# Patient Record
Sex: Female | Born: 1980
Health system: Southern US, Community
[De-identification: ages and names within clinical notes are randomized; demographics above are authoritative.]

## PROBLEM LIST (undated history)

## (undated) DIAGNOSIS — E079 Disorder of thyroid, unspecified: Secondary | ICD-10-CM

## (undated) DIAGNOSIS — F410 Panic disorder [episodic paroxysmal anxiety] without agoraphobia: Secondary | ICD-10-CM

## (undated) DIAGNOSIS — H539 Unspecified visual disturbance: Secondary | ICD-10-CM

## (undated) HISTORY — PX: BACK SURGERY: SHX140

## (undated) HISTORY — PX: CHOLECYSTECTOMY: SHX55

## (undated) HISTORY — DX: Unspecified visual disturbance: H53.9

## (undated) HISTORY — PX: OTHER SURGICAL HISTORY: SHX169

## (undated) HISTORY — PX: CERVICAL SPINE SURGERY: SHX589

---

## 2016-10-07 ENCOUNTER — Emergency Department (HOSPITAL_BASED_OUTPATIENT_CLINIC_OR_DEPARTMENT_OTHER)
Admit: 2016-10-07 | Discharge: 2016-10-07 | Disposition: A | Discharge status: Home / Self Care | Payer: BLUE CROSS/BLUE SHIELD | Attending: Emergency Medicine | Admitting: Emergency Medicine

## 2016-10-07 ENCOUNTER — Encounter (HOSPITAL_COMMUNITY): Payer: Self-pay | Admitting: *Deleted

## 2016-10-07 ENCOUNTER — Emergency Department (HOSPITAL_COMMUNITY)
Admission: EM | Admit: 2016-10-07 | Discharge: 2016-10-07 | Disposition: A | Discharge status: Home / Self Care | Payer: BLUE CROSS/BLUE SHIELD | Attending: Emergency Medicine | Admitting: Emergency Medicine

## 2016-10-07 DIAGNOSIS — S8992XA Unspecified injury of left lower leg, initial encounter: Secondary | ICD-10-CM | POA: Diagnosis present

## 2016-10-07 DIAGNOSIS — Y939 Activity, unspecified: Secondary | ICD-10-CM | POA: Insufficient documentation

## 2016-10-07 DIAGNOSIS — S8412XA Injury of peroneal nerve at lower leg level, left leg, initial encounter: Secondary | ICD-10-CM | POA: Diagnosis not present

## 2016-10-07 DIAGNOSIS — X58XXXA Exposure to other specified factors, initial encounter: Secondary | ICD-10-CM | POA: Diagnosis not present

## 2016-10-07 DIAGNOSIS — Y929 Unspecified place or not applicable: Secondary | ICD-10-CM | POA: Diagnosis not present

## 2016-10-07 DIAGNOSIS — Y999 Unspecified external cause status: Secondary | ICD-10-CM | POA: Insufficient documentation

## 2016-10-07 DIAGNOSIS — S8492XA Injury of unspecified nerve at lower leg level, left leg, initial encounter: Secondary | ICD-10-CM

## 2016-10-07 DIAGNOSIS — M79609 Pain in unspecified limb: Secondary | ICD-10-CM | POA: Diagnosis not present

## 2016-10-07 MED ORDER — METHYLPREDNISOLONE 4 MG PO TBPK: ORAL_TABLET | ORAL | 0 refills | Status: DC

## 2016-10-07 MED ORDER — TRAMADOL HCL 50 MG PO TABS: 50.0000 mg | ORAL_TABLET | Freq: Four times a day (QID) | ORAL | 0 refills | Status: DC | PRN

## 2016-10-07 NOTE — ED Provider Notes (Signed)
[facility]-EMERGENCY DEPT Provider Note   CSN: [id number] Arrival date & time: 10/07/16  1108   By signing my name below, I, [name], attest that this documentation has been prepared under the direction and in the presence of Shaune Pollack, MD. Electronically Signed: [name], ED Scribe. 10/07/16. 2:51 PM.  History   Chief Complaint Chief Complaint  Patient presents with  . Leg Pain    HPI Emily Miles is a 36 y.o. female who presents to the Emergency Department complaining of sharp left groin pain onset yesterday. Pt reports associated left foot numbness. Pt has tried Rx tramadol with no relief of her symptoms. Pt states that she drove 4 hours straight 2 days ago and was sent to the ED by Urgent Care to rule out a DVT. Pt reports that her left groin pain radiates to her left thigh and left calf. Pt left groin pain is worsened with laying flat and alleviated with position change. Pt notes that she has had two similar episodes in the past with no conclusive results. She denies back pain, vision change, numbness in hands, weakness in hands, right leg pain, recent injury, recent trauma, and any other symptoms. Denies being evaluated by a neurologist.    The history is provided by the patient. No language interpreter was used.    History reviewed. No pertinent past medical history.  There are no active problems to display for this patient.   Past Surgical History:  Procedure Laterality Date  . BACK SURGERY    . CHOLECYSTECTOMY    . THYROIDECTOMY      OB History    No data available       Home Medications    Prior to Admission medications   Medication Sig Start Date End Date Taking? Authorizing Provider  methylPREDNISolone (MEDROL DOSEPAK) 4 MG TBPK tablet Take as directed 10/07/16   Shaune Pollack, MD  traMADol (ULTRAM) 50 MG tablet Take 1-2 tablets (50-100 mg total) by mouth every 6 (six) hours as needed for moderate pain or severe pain. 10/07/16   Shaune Pollack, MD     Family History No family history on file.  Social History Social History  Substance Use Topics  . Smoking status: Not on file  . Smokeless tobacco: Not on file  . Alcohol use Not on file     Allergies   Percocet [oxycodone-acetaminophen]   Review of Systems Review of Systems  Musculoskeletal: Positive for myalgias (left thigh and left calf). Negative for arthralgias.  Neurological: Positive for numbness (left foot).  All other systems reviewed and are negative.    Physical Exam Updated Vital Signs BP 123/84 (BP Location: Left Arm)   Pulse 77   Temp 98.5 F (36.9 C) (Oral)   Resp 18   LMP 10/02/2016   SpO2 100%   Physical Exam  Constitutional: She is oriented to person, place, and time. She appears well-developed and well-nourished. No distress.  HENT:  Head: Normocephalic and atraumatic.  Eyes: Conjunctivae are normal.  Neck: Neck supple.  Cardiovascular: Normal rate, regular rhythm and normal heart sounds.  Exam reveals no friction rub.   No murmur heard. Pulmonary/Chest: Effort normal and breath sounds normal. No respiratory distress. She has no wheezes. She has no rales.  Abdominal: She exhibits no distension.  Musculoskeletal: She exhibits no edema.  Neurological: She is alert and oriented to person, place, and time. She exhibits normal muscle tone. Gait normal.  Nl gait. See below.  Skin: Skin is warm. Capillary refill takes  less than 2 seconds.  Psychiatric: She has a normal mood and affect.  Nursing note and vitals reviewed.   Neurological Exam:  Mental Status: Alert and oriented to person, place, and time. Attention and concentration normal. Speech clear. Recent memory is intact. Cranial Nerves: Visual fields grossly intact. EOMI and PERRLA. No nystagmus noted. Facial sensation intact at forehead, maxillary cheek, and chin/mandible bilaterally. No facial asymmetry or weakness. Hearing grossly normal. Uvula is midline, and palate elevates  symmetrically. Normal SCM and trapezius strength. Tongue midline without fasciculations. Motor: Muscle strength 5/5 in proximal and distal UE and LE bilaterally. No pronator drift. Muscle tone normal. Reflexes: 2+ and symmetrical in all four extremities.  Sensation: Intact to light touch in upper and lower extremities distally bilaterally.  Gait: Normal without ataxia. Coordination: Normal FTN bilaterally.   LOWER EXTREMITY EXAM: BILATERAL  INSPECTION & PALPATION: No gross deformity.  No swelling.  No open wounds.  No tenderness to palpation.  SENSORY: sensation is intact to light touch in:  Superficial peroneal nerve distribution (over dorsum of foot) Deep peroneal nerve distribution (over first dorsal web space) Sural nerve distribution (over lateral aspect 5th metatarsal) Saphenous nerve distribution (over medial instep)  MOTOR:  + Motor EHL (great toe dorsiflexion) + FHL (great toe plantar flexion)  + TA (ankle dorsiflexion)  + GSC (ankle plantar flexion)  VASCULAR: 2+ dorsalis pedis and posterior tibialis pulses Capillary refill < 2 sec, toes warm and well-perfused  COMPARTMENTS: Soft, warm, well-perfused No pain with passive extension No parethesias     ED Treatments / Results  DIAGNOSTIC STUDIES: Oxygen Saturation is 100% on RA, nl by my interpretation.    COORDINATION OF CARE: 2:38 PM Discussed treatment plan with pt at bedside which includes LE venous, ultram Rx, medrol dose pack Rx and pt agreed to plan.   Radiology No results found.  Procedures Procedures (including critical care time)  Medications Ordered in ED Medications - No data to display   Initial Impression / Assessment and Plan / ED Course  I have reviewed the triage vital signs and the nursing notes.  Pertinent imaging results that were available during my care of the patient were reviewed by me and considered in my medical decision making (see chart for details).    36 yo F with  PMHx of recurrent transient UE and LE neuropathic pain and weakness, s/p extensive spine w/u, here with mild aching, sharp, throbbing LLE pain that began after travelling to [place]. Sent [facility] from [facility] for eval DVT. Exam as above. Suspect neuropathic pain/neuropraxia, possibly 2/2 meuralgia paresthetica exacerbated by sitting down for prolonged period of time. No midline back pain, exam is not c/w radiculopathy. DVT study is neg and exam shows fully intact distal NV. She o/w has no vision changes, HA, UE symptoms, or sx to suggest MS or systemic neurological d/o, though must consider AIDP, transient polyneuropathy given history. Will give burst dose of steroids, analgesia, and refer for outpt neuro f/u.  Final Clinical Impressions(s) / ED Diagnoses   Final diagnoses:  Neurapraxia of left lower extremity, initial encounter    New Prescriptions New Prescriptions   METHYLPREDNISOLONE (MEDROL DOSEPAK) 4 MG TBPK TABLET    Take as directed   TRAMADOL (ULTRAM) 50 MG TABLET    Take 1-2 tablets (50-100 mg total) by mouth every 6 (six) hours as needed for moderate pain or severe pain.    I personally performed the services described in this documentation, which was scribed in my presence. The recorded  information has been reviewed and is accurate.     Shaune Pollackameron Nero Sawatzky, MD 10/07/16 [phone]

## 2016-10-07 NOTE — ED Triage Notes (Addendum)
Pt reports left groin pain that radiates down her leg. Pt  [facility]. Pt reports 4 hr straight drive on Monday.

## 2016-10-07 NOTE — Progress Notes (Signed)
*  PRELIMINARY RESULTS* Vascular Ultrasound Left lower extremity venous duplex has been completed.  Preliminary findings: No evidence of DVT or baker's cyst. No evidence of superficial thrombosis.    Farrel DemarkJill Eunice, RDMS, RVT  10/07/2016, 2:06 PM

## 2017-03-06 ENCOUNTER — Emergency Department (HOSPITAL_COMMUNITY)
Admission: EM | Admit: 2017-03-06 | Discharge: 2017-03-06 | Disposition: A | Discharge status: Home / Self Care | Payer: BLUE CROSS/BLUE SHIELD | Attending: Emergency Medicine | Admitting: Emergency Medicine

## 2017-03-06 ENCOUNTER — Encounter (HOSPITAL_COMMUNITY): Payer: Self-pay | Admitting: Emergency Medicine

## 2017-03-06 DIAGNOSIS — R0602 Shortness of breath: Secondary | ICD-10-CM | POA: Diagnosis not present

## 2017-03-06 DIAGNOSIS — R112 Nausea with vomiting, unspecified: Secondary | ICD-10-CM | POA: Diagnosis not present

## 2017-03-06 DIAGNOSIS — Z9049 Acquired absence of other specified parts of digestive tract: Secondary | ICD-10-CM | POA: Insufficient documentation

## 2017-03-06 DIAGNOSIS — F410 Panic disorder [episodic paroxysmal anxiety] without agoraphobia: Secondary | ICD-10-CM | POA: Insufficient documentation

## 2017-03-06 DIAGNOSIS — H538 Other visual disturbances: Secondary | ICD-10-CM | POA: Insufficient documentation

## 2017-03-06 DIAGNOSIS — R42 Dizziness and giddiness: Secondary | ICD-10-CM

## 2017-03-06 HISTORY — DX: Panic disorder (episodic paroxysmal anxiety): F41.0

## 2017-03-06 LAB — BASIC METABOLIC PANEL
Anion gap: 7 (ref 5–15)
BUN: 12 mg/dL (ref 6–20)
CO2: 22 mmol/L (ref 22–32)
Calcium: 8.4 mg/dL — ABNORMAL LOW (ref 8.9–10.3)
Chloride: 107 mmol/L (ref 101–111)
Creatinine, Ser: 0.91 mg/dL (ref 0.44–1.00)
GFR calc Af Amer: >60 mL/min (ref >60)
GFR calc non Af Amer: >60 mL/min (ref >60)
Glucose, Bld: 95 mg/dL (ref 65–99)
Potassium: 3.6 mmol/L (ref 3.5–5.1)
Sodium: 136 mmol/L (ref 135–145)

## 2017-03-06 LAB — CBC WITH DIFFERENTIAL/PLATELET
Basophils Absolute: 0 10*3/uL (ref 0.0–0.1)
Basophils Relative: 0 %
Eosinophils Absolute: 0.2 10*3/uL (ref 0.0–0.7)
Eosinophils Relative: 2 %
HCT: 36.8 % (ref 36.0–46.0)
Hemoglobin: 11.9 g/dL — ABNORMAL LOW (ref 12.0–15.0)
Lymphocytes Relative: 36 %
Lymphs Abs: 3.3 10*3/uL (ref 0.7–4.0)
MCH: 28.5 pg (ref 26.0–34.0)
MCHC: 32.3 g/dL (ref 30.0–36.0)
MCV: 88.2 fL (ref 78.0–100.0)
Monocytes Absolute: 0.5 10*3/uL (ref 0.1–1.0)
Monocytes Relative: 6 %
Neutro Abs: 5.2 10*3/uL (ref 1.7–7.7)
Neutrophils Relative %: 56 %
Platelets: 219 10*3/uL (ref 150–400)
RBC: 4.17 MIL/uL (ref 3.87–5.11)
RDW: 13.7 % (ref 11.5–15.5)
WBC: 9.2 10*3/uL (ref 4.0–10.5)

## 2017-03-06 LAB — TSH: TSH: 6.067 u[IU]/mL — ABNORMAL HIGH (ref 0.350–4.500)

## 2017-03-06 LAB — CARBOXYHEMOGLOBIN - COOX: Carboxyhemoglobin: 1.1 % (ref 0.5–1.5)

## 2017-03-06 MED ORDER — SODIUM CHLORIDE 0.9 % IV BOLUS (SEPSIS)
1000.0000 mL | Freq: Once | INTRAVENOUS | Status: AC
  Administered 2017-03-06: 1000 mL via INTRAVENOUS

## 2017-03-06 NOTE — Discharge Instructions (Signed)
Your TSH today was 6.067. Please see your doctor for further evaluation and treatment of this, as well as follow-up of today's visit. Make sure to drink at least 64oz of water daily. Please also see your eye doctor for further evaluation and treatment. Please return to the emergency department immediately if you develop any new or worsening symptoms.
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
## 2017-03-06 NOTE — ED Triage Notes (Signed)
Pt st's after driving 4 hours she went into her house and began shaking, became diaphoretic and vomited x's 1  Pt also st's her vision was blurry at the time.

## 2017-03-08 ENCOUNTER — Encounter (HOSPITAL_COMMUNITY): Payer: Self-pay | Admitting: Emergency Medicine

## 2017-03-08 ENCOUNTER — Emergency Department (HOSPITAL_COMMUNITY)
Admission: EM | Admit: 2017-03-08 | Discharge: 2017-03-09 | Disposition: A | Discharge status: Home / Self Care | Payer: BLUE CROSS/BLUE SHIELD | Attending: Emergency Medicine | Admitting: Emergency Medicine

## 2017-03-08 DIAGNOSIS — R110 Nausea: Secondary | ICD-10-CM | POA: Diagnosis not present

## 2017-03-08 DIAGNOSIS — R531 Weakness: Secondary | ICD-10-CM

## 2017-03-08 DIAGNOSIS — Z79899 Other long term (current) drug therapy: Secondary | ICD-10-CM | POA: Diagnosis not present

## 2017-03-08 DIAGNOSIS — R197 Diarrhea, unspecified: Secondary | ICD-10-CM | POA: Diagnosis not present

## 2017-03-08 DIAGNOSIS — E039 Hypothyroidism, unspecified: Secondary | ICD-10-CM | POA: Insufficient documentation

## 2017-03-08 DIAGNOSIS — R51 Headache: Secondary | ICD-10-CM | POA: Diagnosis not present

## 2017-03-08 DIAGNOSIS — R55 Syncope and collapse: Secondary | ICD-10-CM | POA: Diagnosis present

## 2017-03-08 DIAGNOSIS — M6281 Muscle weakness (generalized): Secondary | ICD-10-CM | POA: Insufficient documentation

## 2017-03-08 HISTORY — DX: Disorder of thyroid, unspecified: E07.9

## 2017-03-08 LAB — BASIC METABOLIC PANEL
ANION GAP: 10 (ref 5–15)
BUN: 12 mg/dL (ref 6–20)
CO2: 22 mmol/L (ref 22–32)
Calcium: 8.6 mg/dL — ABNORMAL LOW (ref 8.9–10.3)
Chloride: 108 mmol/L (ref 101–111)
Creatinine, Ser: 0.89 mg/dL (ref 0.44–1.00)
Glucose, Bld: 97 mg/dL (ref 65–99)
Potassium: 3.9 mmol/L (ref 3.5–5.1)
Sodium: 140 mmol/L (ref 135–145)

## 2017-03-08 LAB — CBC
HCT: 36.4 % (ref 36.0–46.0)
HEMOGLOBIN: 12.1 g/dL (ref 12.0–15.0)
MCH: 28.8 pg (ref 26.0–34.0)
MCHC: 33.2 g/dL (ref 30.0–36.0)
MCV: 86.7 fL (ref 78.0–100.0)
Platelets: 234 10*3/uL (ref 150–400)
RBC: 4.2 MIL/uL (ref 3.87–5.11)
RDW: 13.7 % (ref 11.5–15.5)
WBC: 8.9 10*3/uL (ref 4.0–10.5)

## 2017-03-08 LAB — URINALYSIS, ROUTINE W REFLEX MICROSCOPIC
Bilirubin Urine: NEGATIVE
Glucose, UA: NEGATIVE mg/dL
Hgb urine dipstick: NEGATIVE
Ketones, ur: NEGATIVE mg/dL
LEUKOCYTES UA: NEGATIVE
NITRITE: NEGATIVE
Protein, ur: NEGATIVE mg/dL
SPECIFIC GRAVITY, URINE: 1.012 (ref 1.005–1.030)
pH: 7 (ref 5.0–8.0)

## 2017-03-08 LAB — PREGNANCY, URINE: PREG TEST UR: NEGATIVE

## 2017-03-08 LAB — I-STAT BETA HCG BLOOD, ED (MC, WL, AP ONLY): HCG, QUANTITATIVE: 5.3 m[IU]/mL — AB (ref <5)

## 2017-03-08 MED ORDER — SODIUM CHLORIDE 0.9 % IV BOLUS (SEPSIS)
1000.0000 mL | Freq: Once | INTRAVENOUS | Status: AC
  Administered 2017-03-08: 1000 mL via INTRAVENOUS

## 2017-03-08 NOTE — ED Triage Notes (Signed)
[facility] when she had near syncopal episode. Got really dizzy, weakness in legs, blurred vision, shaking, SOB.  Patient sates that she had same symptoms on Saturday and [facility]. Patient had diarrhea since Saturday.

## 2017-03-09 LAB — T4, FREE: FREE T4: 0.63 ng/dL (ref 0.61–1.12)

## 2017-03-09 LAB — TSH: TSH: 7.89 u[IU]/mL — AB (ref 0.350–4.500)

## 2017-03-09 MED ORDER — LEVOTHYROXINE SODIUM 125 MCG PO TABS
125.0000 ug | ORAL_TABLET | Freq: Every day | ORAL | Status: DC
  Administered 2017-03-09: 125 ug via ORAL
  Filled 2017-03-09: qty 1

## 2017-03-09 MED ORDER — LEVOTHYROXINE SODIUM 125 MCG PO TABS: 125.0000 ug | ORAL_TABLET | Freq: Every day | ORAL | 0 refills | Status: DC

## 2017-03-09 NOTE — ED Notes (Signed)
Tolerating fluids and sandwich well.

## 2017-03-09 NOTE — ED Notes (Signed)
Pt was assisted to restroom with steady.

## 2017-03-09 NOTE — Discharge Instructions (Addendum)
Follow up with endocrinology - 2 names for referral have been provided. Return here with any worsening symptoms or new concerns. Stop taking your Synthroid 112 mcg and take Synthroid 125 mcg daily.

## 2017-03-09 NOTE — ED Notes (Signed)
ED Provider at bedside. 

## 2017-03-09 NOTE — ED Provider Notes (Signed)
[facility]-EMERGENCY DEPT Provider Note   CSN: [id number] Arrival date & time: 03/08/17  1748     History   Chief Complaint Chief Complaint  Patient presents with  . Near Syncope  . Diarrhea    HPI Emily Miles is a 36 y.o. female.  Patient presents with multiple, recurrent episodes profound generalized weakness, "legs unable to hold me up", nausea, headache, diaphoresis. She [facility], felt better and was discharged home. Her second episode was today PTA. She reports a busy morning of errands trying to get ready for an event she is planning for her work and being asymptomatic until the last stop [facility] [employer] when she felt suddenly weak and went to the floor. No syncope or injury. No recent fever or illness.       Past Medical History:  Diagnosis Date  . Panic attacks   . Thyroid disease     There are no active problems to display for this patient.   Past Surgical History:  Procedure Laterality Date  . BACK SURGERY    . CHOLECYSTECTOMY    . THYROIDECTOMY      OB History    No data available       Home Medications    Prior to Admission medications   Medication Sig Start Date End Date Taking? Authorizing Provider  cetirizine (ZYRTEC) 10 MG tablet Take 10 mg by mouth daily.   Yes [provider]  liothyronine (CYTOMEL) 5 MCG tablet Take 5 mcg by mouth 2 (two) times daily.  12/23/16  Yes [provider]  NUVARING 0.12-0.015 MG/24HR vaginal ring Place 1 each vaginally every 28 (twenty-eight) days.  02/03/17  Yes [provider]  sertraline (ZOLOFT) 100 MG tablet Take 100 mg by mouth at bedtime.  02/26/17  Yes [provider]  SYNTHROID 112 MCG tablet Take 112 mcg by mouth daily before breakfast.  02/15/17  Yes [provider]  traZODone (DESYREL) 50 MG tablet Take 50 mg by mouth at bedtime.  01/20/17  Yes [provider]  methylPREDNISolone (MEDROL DOSEPAK) 4 MG TBPK tablet Take as  directed Patient not taking: Reported on 03/08/2017 10/07/16   Shaune PollackIsaacs, Cameron, MD  traMADol (ULTRAM) 50 MG tablet Take 1-2 tablets (50-100 mg total) by mouth every 6 (six) hours as needed for moderate pain or severe pain. Patient not taking: Reported on 03/08/2017 10/07/16   Shaune PollackIsaacs, Cameron, MD    Family History No family history on file.  Social History Social History  Substance Use Topics  . Smoking status: Never Smoker  . Smokeless tobacco: Never Used  . Alcohol use Yes     Allergies   Percocet [oxycodone-acetaminophen]   Review of Systems Review of Systems  Constitutional: Positive for diaphoresis. Negative for chills and fever.  HENT: Negative.   Respiratory: Negative.   Cardiovascular: Negative.   Gastrointestinal: Positive for nausea.  Genitourinary: Negative.   Musculoskeletal: Negative.   Skin: Negative.   Neurological: Positive for dizziness and weakness.     Physical Exam Updated Vital Signs BP 124/73 (BP Location: Right Arm)   Pulse 81   Temp 98.2 F (36.8 C) (Oral)   Resp 13   Ht 5\' 9"  (1.753 m)   Wt 80.3 kg (177 lb)   SpO2 97%   BMI 26.14 kg/m   Physical Exam  Constitutional: She is oriented to person, place, and time. She appears well-developed and well-nourished.  HENT:  Head: Normocephalic.  Neck: Normal range of motion.  Neck supple.  Cardiovascular: Normal rate and regular rhythm.   No murmur heard. Pulmonary/Chest: Effort normal and breath sounds normal. She has no wheezes. She has no rales.  Abdominal: Soft. Bowel sounds are normal. There is no tenderness. There is no rebound and no guarding.  Musculoskeletal: Normal range of motion. She exhibits no edema.  Neurological: She is alert and oriented to person, place, and time. She displays normal reflexes. No sensory deficit. She exhibits normal muscle tone. Coordination normal.  Skin: Skin is warm and dry. No rash noted.  Psychiatric: She has a normal mood and affect.     ED Treatments /  Results  Labs (all labs ordered are listed, but only abnormal results are displayed) Labs Reviewed  BASIC METABOLIC PANEL - Abnormal; Notable for the following:       Result Value   Calcium 8.6 (*)    All other components within normal limits  URINALYSIS, ROUTINE W REFLEX MICROSCOPIC - Abnormal; Notable for the following:    APPearance HAZY (*)    All other components within normal limits  TSH - Abnormal; Notable for the following:    TSH 7.890 (*)    All other components within normal limits  I-STAT BETA HCG BLOOD, ED ([facility], [facility], [facility] ONLY) - Abnormal; Notable for the following:    I-stat hCG, quantitative 5.3 (*)    All other components within normal limits  CBC  PREGNANCY, URINE  T3  T4, FREE  CBG MONITORING, ED    EKG  EKG Interpretation  Date/Time:  Monday March 08 2017 16:07:24 EDT Ventricular Rate:  93 PR Interval:    QRS Duration: 71 QT Interval:  365 QTC Calculation: 454 R Axis:   74 Text Interpretation:  Sinus rhythm normal. no change Confirmed by Arby Barrette [phone]) on 03/08/2017 11:55:38 PM       Radiology No results found.  Procedures Procedures (including critical care time)  Medications Ordered in ED Medications  levothyroxine (SYNTHROID, LEVOTHROID) tablet 125 mcg (not administered)  sodium chloride 0.9 % bolus 1,000 mL (0 mLs Intravenous Stopped 03/09/17 0219)     Initial Impression / Assessment and Plan / ED Course  I have reviewed the triage vital signs and the nursing notes.  Pertinent labs & imaging results that were available during my care of the patient were reviewed by me and considered in my medical decision making (see chart for details).     Patient here for evaluation of weakness as described in HPI. 2nd visit for same.   IVF's given with some improvement. She reports she still have difficulty standing secondary to weakness.   She has a history of thyroidectomy, now on Synthroid. She states that a TSH was done on first evaluation  and found to be elevated. She reports last normal check was in May of this year. Repeat TSH here is elevated to 7.890, more than previous on 03/06/17, found on chart review, of 6.067.   Consider worsening hypothyroid state contributing to symptoms. Will increase Synthroid from 112 mcg to 125 mcg. She will follow up with her doctor for recheck this week. She is still weak but reports she wants to go home and follow up outpatient.   Final Clinical Impressions(s) / ED Diagnoses   Final diagnoses:  Hypothyroidism, unspecified type  Weakness    New Prescriptions New Prescriptions   No medications on file     Danne Harbor 03/15/17 [id number]    Paula Libra, MD 03/18/17 2239

## 2017-03-10 LAB — T3: T3 TOTAL: 90 ng/dL (ref 71–180)

## 2017-03-11 ENCOUNTER — Ambulatory Visit (HOSPITAL_COMMUNITY)
Admission: RE | Admit: 2017-03-11 | Discharge: 2017-03-11 | Disposition: A | Discharge status: Home / Self Care | Payer: BLUE CROSS/BLUE SHIELD | Source: Ambulatory Visit | Attending: Neurology | Admitting: Neurology

## 2017-03-11 ENCOUNTER — Telehealth: Payer: Self-pay | Admitting: Neurology

## 2017-03-11 ENCOUNTER — Encounter: Payer: Self-pay | Admitting: Neurology

## 2017-03-11 ENCOUNTER — Ambulatory Visit (INDEPENDENT_AMBULATORY_CARE_PROVIDER_SITE_OTHER): Payer: BLUE CROSS/BLUE SHIELD | Admitting: Neurology

## 2017-03-11 VITALS — BP 116/72 | HR 84 | Resp 16 | Ht 68.5 in | Wt 173.5 lb

## 2017-03-11 DIAGNOSIS — M4802 Spinal stenosis, cervical region: Secondary | ICD-10-CM | POA: Diagnosis not present

## 2017-03-11 DIAGNOSIS — M5124 Other intervertebral disc displacement, thoracic region: Secondary | ICD-10-CM | POA: Insufficient documentation

## 2017-03-11 DIAGNOSIS — R29898 Other symptoms and signs involving the musculoskeletal system: Secondary | ICD-10-CM | POA: Insufficient documentation

## 2017-03-11 DIAGNOSIS — R200 Anesthesia of skin: Secondary | ICD-10-CM

## 2017-03-11 DIAGNOSIS — Z981 Arthrodesis status: Secondary | ICD-10-CM | POA: Insufficient documentation

## 2017-03-11 DIAGNOSIS — Z9889 Other specified postprocedural states: Secondary | ICD-10-CM | POA: Insufficient documentation

## 2017-03-11 DIAGNOSIS — M50322 Other cervical disc degeneration at C5-C6 level: Secondary | ICD-10-CM | POA: Insufficient documentation

## 2017-03-11 MED ORDER — METHYLPREDNISOLONE 4 MG PO TABS: ORAL_TABLET | ORAL | 0 refills | Status: DC
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
## 2017-03-11 NOTE — Telephone Encounter (Signed)
Patient is scheduled to [facility]. The [employer] is [id number] (exp. 03/11/17 to 04/09/17). Patient is aware of the time and day.

## 2017-03-12 NOTE — Telephone Encounter (Signed)
-----   Message from Asa Lenteichard A Sater, MD sent at 03/11/2017  5:40 PM EDT ----- Please let her know that I looked at the MRI. The spinal cord looks normal. She has her old fusion at C6-C7 and she has some degenerative changes at the level above at C5-C6, but they are not severe enough to lead to weakness. The MRI of the thoracic spine showed a small disc protrusion at T6-T7 but it is not causing any problems.  Since the MRIs did not show a source for her weakness, we need to check a nerve conduction study/EMG some time in the next week. Please check with Beau to see if she could be put on my schedule or anybody else's schedule next week.

## 2017-03-12 NOTE — Telephone Encounter (Signed)
I have spoken with Victorino DikeJennifer this morning and per RAS, reviewed MRI results as below.  Advised EMG/NCV is needed.  She verbalized understanding of same.  Beau is not here today, so I have printed this message and given it to Robin--she will pass it along to Beau on Monday/fim

## 2017-03-15 ENCOUNTER — Telehealth: Payer: Self-pay | Admitting: Neurology

## 2017-03-15 NOTE — Telephone Encounter (Signed)
I have spoken with Victorino DikeJennifer and advised [facility] needs EM[facility]/NCV.  Once ins. approves it, someone will call her to schedule.  She verbalized understanding of same/fim

## 2017-03-15 NOTE — Telephone Encounter (Signed)
Patient called office in reference to believing she is  to be scheduling a ncv/emg.  Please call.

## 2017-03-17 ENCOUNTER — Other Ambulatory Visit: Payer: BLUE CROSS/BLUE SHIELD

## 2017-03-17 ENCOUNTER — Other Ambulatory Visit: Payer: Self-pay | Admitting: Neurology

## 2017-03-17 DIAGNOSIS — R200 Anesthesia of skin: Secondary | ICD-10-CM

## 2017-03-17 DIAGNOSIS — R29898 Other symptoms and signs involving the musculoskeletal system: Secondary | ICD-10-CM

## 2017-03-18 ENCOUNTER — Encounter (INDEPENDENT_AMBULATORY_CARE_PROVIDER_SITE_OTHER): Payer: Self-pay | Admitting: Diagnostic Neuroimaging

## 2017-03-18 ENCOUNTER — Ambulatory Visit (INDEPENDENT_AMBULATORY_CARE_PROVIDER_SITE_OTHER): Payer: BLUE CROSS/BLUE SHIELD | Admitting: Diagnostic Neuroimaging

## 2017-03-18 DIAGNOSIS — R200 Anesthesia of skin: Secondary | ICD-10-CM

## 2017-03-18 DIAGNOSIS — R29898 Other symptoms and signs involving the musculoskeletal system: Secondary | ICD-10-CM

## 2017-03-18 DIAGNOSIS — Z0289 Encounter for other administrative examinations: Secondary | ICD-10-CM

## 2017-03-19 ENCOUNTER — Ambulatory Visit
Admission: RE | Admit: 2017-03-19 | Discharge: 2017-03-19 | Disposition: A | Discharge status: Home / Self Care | Payer: BLUE CROSS/BLUE SHIELD | Source: Ambulatory Visit | Attending: Family Medicine | Admitting: Family Medicine

## 2017-03-19 ENCOUNTER — Inpatient Hospital Stay
Admission: RE | Admit: 2017-03-19 | Discharge: 2017-03-19 | Disposition: A | Discharge status: Home / Self Care | Payer: BLUE CROSS/BLUE SHIELD | Source: Ambulatory Visit | Attending: Family Medicine | Admitting: Family Medicine

## 2017-03-19 ENCOUNTER — Other Ambulatory Visit: Payer: Self-pay | Admitting: Family Medicine

## 2017-03-19 DIAGNOSIS — E038 Other specified hypothyroidism: Secondary | ICD-10-CM

## 2017-03-19 NOTE — Procedures (Signed)
[facility]  NCS (NERVE CONDUCTION STUDY) WITH EMG (ELECTROMYOGRAPHY) REPORT   STUDY DATE: 03/18/17 PATIENT NAME: Emily SchwalbeJennifer Miles DOB: [birth year] MRN: [id number]  ORDERING CLINICIAN: Marvel PlanJindong Xu, MD PhD   TECHNOLOGIST: Despina Ariasichard Sater, MD PhD  ELECTROMYOGRAPHER: Glenford BayleyVikram R. Penumalli, MD  CLINICAL INFORMATION: 36 year old female with lower extremity numbness and weakness.   FINDINGS: NERVE CONDUCTION STUDY: Bilateral peroneal and tibial motor responses and F wave latencies are normal.   Bilateral tibial H reflex responses are normal.   Bilateral sural and superficial peroneal sensory responses are normal.   NEEDLE ELECTROMYOGRAPHY: Needle examination of right vastus medialis, tibials anterior, gastrocnemius and bilateral lower lumbar paraspinal muscles are normal.   IMPRESSION:  This is a normal study. No electrodiagnostic evidence of large fiber neuropathy or lumbar radiculopathy at this time.     INTERPRETING PHYSICIAN:  Suanne MarkerVIKRAM R. PENUMALLI, MD Certified in Neurology, Neurophysiology and Neuroimaging  [facility] [address], [address] [place], [place] [id number] [phone]  [facility]    Nerve / Sites Muscle Latency Ref. Amplitude Ref. Rel Amp Segments Distance Velocity Ref. Area    ms ms mV mV %  cm m/s m/s mVms  L Peroneal - EDB     Ankle EDB 6.0 ?6.5 6.7 ?2.0 100 Ankle - EDB 9   30.5     Fib head EDB 12.8  5.7  84.6 Fib head - Ankle 30 44 ?44 28.0     Pop fossa EDB 15.1  6.7  118 Pop fossa - Fib head 10 45 ?44 35.2         Pop fossa - Ankle      R Peroneal - EDB     Ankle EDB 5.5 ?6.5 5.7 ?2.0 100 Ankle - EDB 9   26.0     Fib head EDB 12.2  4.7  81.8 Fib head - Ankle 30 45 ?44 21.9     Pop fossa EDB 14.9  4.8  102 Pop fossa - Fib head 12 45 ?44 22.6         Pop fossa - Ankle      L Tibial - AH     Ankle AH 5.2 ?5.8 12.1 ?4.0 100 Ankle - AH 9   38.8     Pop fossa AH 14.3  10.8  88.8 Pop fossa - Ankle 37 41 ?41 38.7  R  Tibial - AH     Ankle AH 5.6 ?5.8 16.1 ?4.0 100 Ankle - AH 9   41.7     Pop fossa AH 14.8  9.8  61.3 Pop fossa - Ankle 38 41 ?41 32.2             SNC    Nerve / Sites Rec. Site Peak Lat Ref.  Amp Ref. Segments Distance    ms ms V V  cm  L Sural - Ankle (Calf)     Calf Ankle 4.1 ?4.4 24 ?6 Calf - Ankle 14  R Sural - Ankle (Calf)     Calf Ankle 4.0 ?4.4 24 ?6 Calf - Ankle 14  L Superficial peroneal - Ankle     Lat leg Ankle 4.2 ?4.4 10 ?6 Lat leg - Ankle 14  R Superficial peroneal - Ankle     Lat leg Ankle 4.3 ?4.4 11 ?6 Lat leg - Ankle 14             F  Wave    Nerve F Lat Ref.   ms ms  L Peroneal - EDB 50.9 ?56.0  L Tibial - AH 50.9 ?56.0  R Peroneal - EDB 49.1 ?56.0  R Tibial - AH 50.6 ?56.0             H Reflex    Nerve H Lat Lat Hmax   ms ms   Left Right Ref. Left Right Ref.  Tibial - Soleus 30.4 30.2 ?35.0 30.7 31.3 ?35.0         EMG full       EMG Summary Table    Spontaneous MUAP Recruitment  Muscle IA Fib PSW Fasc Other Amp Dur. Poly Pattern  R. Lumbar paraspinals Normal None None None _______ Normal Normal Normal Normal  R. Vastus medialis Normal None None None _______ Normal Normal Normal Normal  R. Tibialis anterior Normal None None None _______ Normal Normal Normal Normal  R. Gastrocnemius (Medial head) Normal None None None _______ Normal Normal Normal Normal

## 2017-03-22 ENCOUNTER — Telehealth: Payer: Self-pay | Admitting: *Deleted

## 2017-03-22 NOTE — Telephone Encounter (Signed)
-----   Message from Asa Lenteichard A Sater, MD sent at 03/19/2017  1:39 PM EDT ----- Please let her know that the nerve conduction study was normal.

## 2017-03-22 NOTE — Telephone Encounter (Signed)
I have spoken with Emily Miles this afternoon and per RAS, advised that EMG/NCV was normal.  She verbalized understanding of same/fim

## 2017-03-26 ENCOUNTER — Telehealth: Payer: Self-pay | Admitting: Endocrinology

## 2017-03-26 NOTE — Telephone Encounter (Signed)
Patient called in reference to getting sooner appointment due to school starting and having leg weakness.   Please call patient and advise. OK to leave message.

## 2017-03-26 NOTE — Telephone Encounter (Signed)
Please see if patient can come in sooner than current appointment. Please schedule. Thanks!

## 2017-03-27 NOTE — Telephone Encounter (Signed)
She is being followed by neurologist for weakness, her thyroid level is only minimally abnormal and do not think there is a connection, can only see her with a minimum 30 minute scheduled availability

## 2017-04-02 NOTE — Telephone Encounter (Signed)
As of today there is nothing sooner than her current appt.

## 2017-04-05 ENCOUNTER — Telehealth: Payer: Self-pay | Admitting: Endocrinology

## 2017-04-05 NOTE — Telephone Encounter (Signed)
Patient would like to know if she can get an earlier new patient appointment than 08/22.

## 2017-04-21 ENCOUNTER — Encounter: Payer: Self-pay | Admitting: Endocrinology

## 2017-04-21 ENCOUNTER — Ambulatory Visit (INDEPENDENT_AMBULATORY_CARE_PROVIDER_SITE_OTHER): Payer: BLUE CROSS/BLUE SHIELD | Admitting: Endocrinology

## 2017-04-21 VITALS — BP 108/68 | HR 97 | Ht 68.0 in | Wt 178.6 lb

## 2017-04-21 DIAGNOSIS — R635 Abnormal weight gain: Secondary | ICD-10-CM | POA: Diagnosis not present

## 2017-04-21 DIAGNOSIS — E063 Autoimmune thyroiditis: Secondary | ICD-10-CM

## 2017-04-21 DIAGNOSIS — M6281 Muscle weakness (generalized): Secondary | ICD-10-CM

## 2017-04-21 DIAGNOSIS — R531 Weakness: Secondary | ICD-10-CM

## 2017-04-21 NOTE — Progress Notes (Signed)
Patient ID: Emily Miles, female   DOB: 03/04/1981, 36 y.o.   MRN: [id number]           Referring Physician: Reola Calkins  Reason for Appointment:  Hypothyroidism and weakness, new visit    History of Present Illness:   The patient has been on thyroid supplements since 2001 Initially apparently she was told to take thyroid supplement because of having a thyroid nodule or cyst She had hemithyroidectomy in 2002 for a thyroid nodule that was benign pathology  She had been continued on thyroid supplement subsequently and apparently was needing progressively higher doses of levothyroxine About 6 years ago patient was switched by an endocrinologist to Cytomel and levothyroxine When she was taking 10 g of Cytomel twice a day she was having palpitations and feeling jittery and the dose was reduced  At times she has had an increase of her thyroid medication made an when her thyroid levels are low she feels exhausted and has some weight gain but no other typical symptoms She had been feeling fairly well this year until an episode of syncope in July. In April 2018 her TSH done by her PCP was 0.7  She has been taking 125 g of levothyroxine since 7/18 when her TSH was high  However the patient continues to feel weak and tired and also is gaining weight She has not been able to be active or do much walking because of leg weakness         Patient's weight history is as follows:  Wt Readings from Last 3 Encounters:  04/21/17 178 lb 9.6 oz (81 kg)  03/11/17 173 lb 8 oz (78.7 kg)  03/08/17 177 lb (80.3 kg)    Thyroid function results have been as follows:  Lab Results  Component Value Date   TSH 7.890 (H) 03/09/2017   TSH 6.067 (H) 03/06/2017   FREET4 0.63 03/09/2017    Lab Results  Component Value Date   TSH 7.890 (H) 03/09/2017   TSH 6.067 (H) 03/06/2017      Past Medical History:  Diagnosis Date  . Panic attacks   . Thyroid disease   . Vision abnormalities     Past Surgical  History:  Procedure Laterality Date  . BACK SURGERY    . CHOLECYSTECTOMY    . Hemithyroidectomy      Family History  Problem Relation Age of Onset  . Diabetes Mother   . Hypothyroidism Mother   . Diverticulitis Father     Social History:  reports that she has never smoked. She has never used smokeless tobacco. She reports that she drinks alcohol. She reports that she does not use drugs.  Allergies:  Allergies  Allergen Reactions  . Percocet [Oxycodone-Acetaminophen]     Allergies as of 04/21/2017      Reactions   Percocet [oxycodone-acetaminophen]       Medication List       Accurate as of 04/21/17  3:10 PM. Always use your most recent med list.          buPROPion 150 MG 24 hr tablet Commonly known as:  WELLBUTRIN XL   cetirizine 10 MG tablet Commonly known as:  ZYRTEC Take 10 mg by mouth daily.   levothyroxine 125 MCG tablet Commonly known as:  SYNTHROID Take 1 tablet (125 mcg total) by mouth daily before breakfast.   liothyronine 5 MCG tablet Commonly known as:  CYTOMEL Take 5 mcg by mouth 2 (two) times daily.   Melatonin 5 MG Tabs Take  1 tablet by mouth daily. Takes at night   methylPREDNISolone 4 MG Tbpk tablet Commonly known as:  MEDROL DOSEPAK See admin instructions.   NUVARING 0.12-0.015 MG/24HR vaginal ring Generic drug:  etonogestrel-ethinyl estradiol Place 1 each vaginally every 28 (twenty-eight) days.   PROBIOTIC ACIDOPHILUS PO Take 1 capsule by mouth daily. Takes in the morning   sertraline 100 MG tablet Commonly known as:  ZOLOFT Take 50 mg by mouth at bedtime.   sertraline 25 MG tablet Commonly known as:  ZOLOFT   traZODone 50 MG tablet Commonly known as:  DESYREL Take 50 mg by mouth at bedtime.            Discharge Care Instructions        Start     Ordered   04/21/17 0000  Cortisol     04/21/17 1505   04/21/17 0000  TSH     04/21/17 1505   04/21/17 0000  T4, free     04/21/17 1505   04/21/17 0000  T3, free      04/21/17 1505   04/21/17 0000  Estradiol     04/21/17 1505   04/21/17 0000  Comprehensive metabolic panel     04/21/17 1505   04/21/17 0000  CK (Creatine Kinase)     04/21/17 1505       Review of Systems  Constitutional: Positive for weight gain.  HENT: Positive for headaches.        Somewhat more recently, has pain in the back of her neck and sides of her head  Cardiovascular: Positive for palpitations.  Gastrointestinal: Positive for diarrhea.  Endocrine: Positive for fatigue and heat intolerance.  Skin: Positive for rash.  Neurological: Positive for weakness. Negative for numbness.  Psychiatric/Behavioral:       Is on treatment for depression   B12 Done previously was 278             Examination:    BP 108/68   Pulse 97   Ht 5\' 8"  (1.727 m)   Wt 178 lb 9.6 oz (81 kg)   SpO2 97%   BMI 27.16 kg/m   GENERAL:  Average build. No features of central obesity with supraclavicular fat pads or buffalo hump  No pallor, clubbing, lymphadenopathy or edema.    Skin:  no pigmentation.  Has some areas of cystic acne on face No hirsutism or excessive body here No alopecia No ecchymoses seen, no thinning of the skin present  EYES:  No prominence of the eyes or swelling of the eyelids.  Questionable prominence of the left optic disk seen, no papilledema.  Blood vessels normal   ENT: Oral mucosa and tongue normal.  THYROID:  Not palpable. No carotid bruit heard  HEART:  Normal  S1 and S2; no murmur or click.  CHEST:    Lungs: Vescicular breath sounds heard equally.  No crepitations/ wheeze.  ABDOMEN:  No distention.  Liver and spleen not palpable.  No other mass or tenderness.  NEUROLOGICAL: The power in the proximal leg muscles is 3+/5 Reflexes are bilaterally normal to brisk at biceps, normal at ankles.  JOINTS:  Normal.   Assessment:  HYPOTHYROIDISM, likely autoimmune with long history of thyroid supplementation Her TSH was relatively higher in July along with  low normal free T4 and T3 levels, however labs were done when she was acutely ill Her symptoms of fatigue and weakness are out of proportion to her TSH level She is taking a relatively higher dose of Synthroid  since her labs were done Although she is taking Cytomel twice a day not clear if she had benefited from using this in combination with levothyroxine and may need to consider stopping it because of recent issues with relatively fast heart rate  Weight gain of unclear etiology, does not appear to have any cushingoid features, may be related to her being less active lately  WEAKNESS especially leg muscles of unclear etiology.  No neurological abnormality found on evaluation  Episodes of syncope/near syncope: Etiology unclear but appear to be cardiovascular related   PLAN:   Check a.m. cortisol, repeat thyroid functions, CPK, chemistry panel and estradiol level  Follow-up to be determined   [facility] 04/21/2017, 3:10 PM   Consultation note copy sent to the PCP  Note: This office note was prepared with Dragon voice recognition system technology. Any transcriptional errors that result from this process are unintentional.

## 2017-04-22 ENCOUNTER — Other Ambulatory Visit (INDEPENDENT_AMBULATORY_CARE_PROVIDER_SITE_OTHER): Payer: BLUE CROSS/BLUE SHIELD

## 2017-04-22 DIAGNOSIS — R531 Weakness: Secondary | ICD-10-CM

## 2017-04-22 DIAGNOSIS — M6281 Muscle weakness (generalized): Secondary | ICD-10-CM | POA: Diagnosis not present

## 2017-04-22 DIAGNOSIS — R635 Abnormal weight gain: Secondary | ICD-10-CM | POA: Diagnosis not present

## 2017-04-22 DIAGNOSIS — E063 Autoimmune thyroiditis: Secondary | ICD-10-CM

## 2017-04-22 LAB — COMPREHENSIVE METABOLIC PANEL
ALBUMIN: 4.2 g/dL (ref 3.5–5.2)
ALT: 14 U/L (ref 0–35)
AST: 15 U/L (ref 0–37)
Alkaline Phosphatase: 45 U/L (ref 39–117)
BUN: 15 mg/dL (ref 6–23)
CALCIUM: 9 mg/dL (ref 8.4–10.5)
CHLORIDE: 107 meq/L (ref 96–112)
CO2: 24 mEq/L (ref 19–32)
Creatinine, Ser: 0.84 mg/dL (ref 0.40–1.20)
GFR: 80.69 mL/min (ref >60.00)
Glucose, Bld: 106 mg/dL — ABNORMAL HIGH (ref 70–99)
POTASSIUM: 4.3 meq/L (ref 3.5–5.1)
SODIUM: 138 meq/L (ref 135–145)
Total Bilirubin: 0.2 mg/dL (ref 0.2–1.2)
Total Protein: 7 g/dL (ref 6.0–8.3)

## 2017-04-22 LAB — CORTISOL: CORTISOL PLASMA: 12.5 ug/dL

## 2017-04-22 LAB — T4, FREE: Free T4: 0.99 ng/dL (ref 0.60–1.60)

## 2017-04-22 LAB — CK: CK TOTAL: 88 U/L (ref 7–177)

## 2017-04-22 LAB — T3, FREE: T3, Free: 3.4 pg/mL (ref 2.3–4.2)

## 2017-04-22 LAB — TSH: TSH: 2.89 u[IU]/mL (ref 0.35–4.50)

## 2017-04-23 ENCOUNTER — Encounter: Payer: Self-pay | Admitting: Endocrinology

## 2017-04-24 ENCOUNTER — Other Ambulatory Visit: Payer: Self-pay | Admitting: Endocrinology

## 2017-04-24 DIAGNOSIS — R635 Abnormal weight gain: Secondary | ICD-10-CM

## 2017-04-24 DIAGNOSIS — M6281 Muscle weakness (generalized): Secondary | ICD-10-CM

## 2017-04-24 MED ORDER — DEXAMETHASONE 1 MG PO TABS: ORAL_TABLET | ORAL | 0 refills | Status: DC

## 2017-04-26 ENCOUNTER — Telehealth: Payer: Self-pay | Admitting: Neurology

## 2017-04-26 NOTE — Telephone Encounter (Signed)
I spoke to Dr. Reola Calkins at [facility] about [place]. She is still having difficulty with weakness.   Spinal cord MRI and EMG were noncontributory.   Please see if we can work her in some time this week.

## 2017-04-26 NOTE — Telephone Encounter (Signed)
I have spoken with Emily Miles this am and given appt. for 04/29/17, arrival time 1300 for a 1330 appt/fim

## 2017-04-26 NOTE — Telephone Encounter (Signed)
Dr. Reola Calkins with [facility] is calling to discuss the patient's health. It is not an emergency. He can be reached at [phone].

## 2017-04-27 ENCOUNTER — Other Ambulatory Visit: Payer: Self-pay | Admitting: Endocrinology

## 2017-04-27 ENCOUNTER — Other Ambulatory Visit (INDEPENDENT_AMBULATORY_CARE_PROVIDER_SITE_OTHER): Payer: BLUE CROSS/BLUE SHIELD

## 2017-04-27 DIAGNOSIS — M6281 Muscle weakness (generalized): Secondary | ICD-10-CM

## 2017-04-27 DIAGNOSIS — E063 Autoimmune thyroiditis: Secondary | ICD-10-CM

## 2017-04-27 DIAGNOSIS — R635 Abnormal weight gain: Secondary | ICD-10-CM | POA: Diagnosis not present

## 2017-04-27 LAB — CORTISOL: Cortisol, Plasma: 0.6 ug/dL

## 2017-04-27 LAB — SEDIMENTATION RATE: Sed Rate: 8 mm/hr (ref 0–20)

## 2017-04-28 LAB — ESTRADIOL: ESTRADIOL: 5.7 pg/mL

## 2017-04-29 ENCOUNTER — Ambulatory Visit
Admission: RE | Admit: 2017-04-29 | Discharge: 2017-04-29 | Disposition: A | Discharge status: Home / Self Care | Payer: BLUE CROSS/BLUE SHIELD | Source: Ambulatory Visit | Attending: Neurology | Admitting: Neurology

## 2017-04-29 ENCOUNTER — Ambulatory Visit (INDEPENDENT_AMBULATORY_CARE_PROVIDER_SITE_OTHER): Payer: BLUE CROSS/BLUE SHIELD | Admitting: Neurology

## 2017-04-29 ENCOUNTER — Encounter: Payer: Self-pay | Admitting: Neurology

## 2017-04-29 VITALS — BP 125/81 | HR 85 | Resp 16 | Ht 68.0 in | Wt 176.5 lb

## 2017-04-29 DIAGNOSIS — R29898 Other symptoms and signs involving the musculoskeletal system: Secondary | ICD-10-CM

## 2017-04-29 DIAGNOSIS — Z9889 Other specified postprocedural states: Secondary | ICD-10-CM

## 2017-04-29 DIAGNOSIS — M545 Low back pain, unspecified: Secondary | ICD-10-CM

## 2017-04-29 MED ORDER — ETODOLAC 400 MG PO TABS: 400.0000 mg | ORAL_TABLET | Freq: Two times a day (BID) | ORAL | 5 refills | Status: DC
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
## 2017-04-30 ENCOUNTER — Telehealth: Payer: Self-pay | Admitting: *Deleted

## 2017-04-30 NOTE — Telephone Encounter (Signed)
-----   Message from Asa Lenteichard A Sater, MD sent at 04/29/2017  5:20 PM EDT ----- Please note that the x-ray of the coccyx and sacrum was normal.

## 2017-04-30 NOTE — Telephone Encounter (Signed)
I have spoken with Emily DikeJennifer this morning and per RAS, advised x-rays of coccyx and sacrum are normal.  She verbalized understanding of same/fim

## 2017-05-05 ENCOUNTER — Ambulatory Visit: Payer: BLUE CROSS/BLUE SHIELD | Admitting: Endocrinology

## 2017-05-05 LAB — STRIATED MUSCLE ANTIBODY: Anti-striation Abs: NEGATIVE

## 2017-05-05 LAB — ACETYLCHOLINE RECEPTOR, MODULATING: Acetylcholine Modulat Ab: <12 % (ref 0–20)

## 2017-05-05 LAB — ACETYLCHOLINE RECEPTOR, BINDING: AChR Binding Ab, Serum: <0.03 nmol/L (ref 0.00–0.24)

## 2017-05-05 LAB — VITAMIN B12: VITAMIN B 12: 343 pg/mL (ref 232–1245)

## 2017-05-05 LAB — ACETYLCHOLINE RECEPTOR, BLOCKING: Acetylchol Block Ab: 12 % (ref 0–25)

## 2017-05-06 ENCOUNTER — Telehealth: Payer: Self-pay | Admitting: *Deleted

## 2017-05-06 NOTE — Telephone Encounter (Signed)
LMOM (identified vm) that per RAS, lab work done in our office is ok.  She does not need to return this call unless she has questions/fim

## 2017-05-06 NOTE — Telephone Encounter (Signed)
-----   Message from Asa Lenteichard A Sater, MD sent at 05/06/2017  8:37 AM EDT ----- Please let the patient know that the lab work is fine.

## 2017-05-07 ENCOUNTER — Other Ambulatory Visit: Payer: Self-pay

## 2017-05-07 MED ORDER — LEVOTHYROXINE SODIUM 125 MCG PO TABS: 125.0000 ug | ORAL_TABLET | Freq: Every day | ORAL | 0 refills | Status: DC

## 2017-05-19 ENCOUNTER — Encounter: Payer: Self-pay | Admitting: Physical Therapy

## 2017-05-19 ENCOUNTER — Ambulatory Visit: Payer: BLUE CROSS/BLUE SHIELD | Attending: Neurology | Admitting: Physical Therapy

## 2017-05-19 DIAGNOSIS — M79605 Pain in left leg: Secondary | ICD-10-CM | POA: Diagnosis present

## 2017-05-19 DIAGNOSIS — R262 Difficulty in walking, not elsewhere classified: Secondary | ICD-10-CM | POA: Insufficient documentation

## 2017-05-19 DIAGNOSIS — R208 Other disturbances of skin sensation: Secondary | ICD-10-CM | POA: Diagnosis present

## 2017-05-19 DIAGNOSIS — M6281 Muscle weakness (generalized): Secondary | ICD-10-CM | POA: Diagnosis present
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
## 2017-05-20 ENCOUNTER — Other Ambulatory Visit: Payer: Self-pay | Admitting: Family Medicine

## 2017-05-20 DIAGNOSIS — R102 Pelvic and perineal pain: Secondary | ICD-10-CM

## 2017-05-20 DIAGNOSIS — R29898 Other symptoms and signs involving the musculoskeletal system: Secondary | ICD-10-CM

## 2017-05-20 DIAGNOSIS — R140 Abdominal distension (gaseous): Secondary | ICD-10-CM

## 2017-05-26 ENCOUNTER — Encounter: Payer: Self-pay | Admitting: Physical Therapy

## 2017-05-26 ENCOUNTER — Ambulatory Visit: Payer: BLUE CROSS/BLUE SHIELD | Admitting: Physical Therapy

## 2017-05-26 DIAGNOSIS — M79605 Pain in left leg: Secondary | ICD-10-CM

## 2017-05-26 DIAGNOSIS — R262 Difficulty in walking, not elsewhere classified: Secondary | ICD-10-CM

## 2017-05-26 DIAGNOSIS — M6281 Muscle weakness (generalized): Secondary | ICD-10-CM

## 2017-05-26 DIAGNOSIS — R208 Other disturbances of skin sensation: Secondary | ICD-10-CM
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
## 2017-05-26 NOTE — Patient Instructions (Addendum)
Gastroc / Heel Cord Stretch - On Step    Stand with heels over edge of stair. Holding rail, lower heels until stretch is felt in calf of legs, hold 30 seconds. Repeat _2__ times. Do _2__ times per day.  Hamstring Stretch    Place sound foot on the floor, keep back and residual limb knee straight. Lean forward until a stretch is felt in back of residual limb thigh. Hold __30__ seconds. Repeat __2__ times. Do __2__ sessions per day.  Piriformis Stretch, Sitting    Sit, one ankle on opposite knee, same-side hand on crossed knee. Push down on knee, keeping spine straight. Lean torso forward, with flat back, until tension is felt in hamstrings and gluteals of crossed-leg side. Hold _30__ seconds.  Repeat __2_ times per session. Do _2__ sessions per day.  BROOM STICK EXERCISE: PLACE BROOM STICK UNDER LEFT LEG, OVER RIGHT LEG.  PRESS DOWN WITH LEFT LEG AND UP WITH RIGHT LEG AT SAME TIME (DON'T HOLD YOUR BREATH) FOR 6 SECONDS.  REPEAT 8-10 TIMES.  Heel Squeeze (Prone)    Abdomen supported, knees apart: bend both knees and press heels together gently for 6 seconds Repeat __10__ times per set. Do __2__ sets day   AFTER HEEL PRESSES ALLOW HEELS TO FALL OUT TO SIDE AND HOLD 3-4 SECONDS, 5-6 TIMES  Hip Internal Rotation (Eccentric) - Prone, Knees Flexed to 90 Degrees    Lie on stomach with knees flexed to 90. Slowly lower legs outward for 3-5 seconds. Do not move knees  Return to start. _10__ reps per set, _2__ sets per day

## 2017-05-27 ENCOUNTER — Ambulatory Visit
Admission: RE | Admit: 2017-05-27 | Discharge: 2017-05-27 | Disposition: A | Discharge status: Home / Self Care | Payer: BLUE CROSS/BLUE SHIELD | Source: Ambulatory Visit | Attending: Family Medicine | Admitting: Family Medicine

## 2017-05-27 DIAGNOSIS — R140 Abdominal distension (gaseous): Secondary | ICD-10-CM

## 2017-05-27 DIAGNOSIS — R29898 Other symptoms and signs involving the musculoskeletal system: Secondary | ICD-10-CM

## 2017-05-27 DIAGNOSIS — R102 Pelvic and perineal pain: Secondary | ICD-10-CM

## 2017-05-31 ENCOUNTER — Ambulatory Visit: Payer: BLUE CROSS/BLUE SHIELD | Admitting: Physical Therapy

## 2017-06-02 ENCOUNTER — Ambulatory Visit: Payer: BLUE CROSS/BLUE SHIELD | Admitting: Physical Therapy

## 2017-06-02 ENCOUNTER — Encounter: Payer: BLUE CROSS/BLUE SHIELD | Admitting: Endocrinology

## 2017-06-02 ENCOUNTER — Encounter: Payer: Self-pay | Admitting: Endocrinology

## 2017-06-02 ENCOUNTER — Other Ambulatory Visit (INDEPENDENT_AMBULATORY_CARE_PROVIDER_SITE_OTHER): Payer: BLUE CROSS/BLUE SHIELD

## 2017-06-02 VITALS — BP 108/76 | HR 86 | Ht 68.0 in | Wt 179.2 lb

## 2017-06-02 DIAGNOSIS — E063 Autoimmune thyroiditis: Secondary | ICD-10-CM | POA: Diagnosis not present

## 2017-06-02 LAB — TSH: TSH: 2.5 u[IU]/mL (ref 0.35–4.50)

## 2017-06-02 LAB — T3, FREE: T3, Free: 2.8 pg/mL (ref 2.3–4.2)

## 2017-06-02 LAB — T4, FREE: Free T4: 0.97 ng/dL (ref 0.60–1.60)

## 2017-06-02 NOTE — Progress Notes (Signed)
This encounter was created in error - please disregard.

## 2017-06-04 ENCOUNTER — Encounter: Payer: Self-pay | Admitting: Physical Therapy

## 2017-06-04 ENCOUNTER — Ambulatory Visit: Payer: BLUE CROSS/BLUE SHIELD | Attending: Neurology | Admitting: Physical Therapy

## 2017-06-04 DIAGNOSIS — M6281 Muscle weakness (generalized): Secondary | ICD-10-CM | POA: Diagnosis not present

## 2017-06-04 DIAGNOSIS — R208 Other disturbances of skin sensation: Secondary | ICD-10-CM | POA: Diagnosis present

## 2017-06-04 DIAGNOSIS — R262 Difficulty in walking, not elsewhere classified: Secondary | ICD-10-CM | POA: Diagnosis present

## 2017-06-04 DIAGNOSIS — M79605 Pain in left leg: Secondary | ICD-10-CM

## 2017-06-04 NOTE — Patient Instructions (Addendum)
Abductor Strength: Bridge Pose (Strap)    Make strap wide enough to brace knees at hip width. Press into strap with knees. Hold for __1__ breaths. Repeat __10__ times.  Copyright  VHI. All rights reserved.   Bridge    Lying on back, legs bent 90, feet flat on floor. Press up hips and torso, reaching hands to feet. Hold for __1__ breaths.  Repeat 10 times ADVANCED: Clasp hands underneath back and squeeze shoulder blades together, lifting upper body onto outside of shoulders. .   Also provided pt with handout of TA bracing exercises  Abdominal Breathing Lying Down    Place one hand on stomach. Breathe in slowly through nose, letting stomach rise. Breathe out gently through pursed lips, letting stomach fall. Breathe out for at least twice as long as you breathe in. Repeat _10___ times, __2__ times daily.  Bracing With Knee Fallout (Hook-Lying)    Breathe IN.  While breathing OUT- tighten pelvic floor and abdominals drop one knee out to side (20-30 deg). Keep opposite hip still.  Return to upright and repeat with other leg.   Repeat _10__ times each leg. Do _2__ times a day.  Bracing With Leg March (Hook-Lying)    Breathe IN.  While breathing OUT- tighten pelvic floor and abdominals and hold. Lift one foot _6-10__ inches and return to floor.  Repeat sequence with other leg lifting. Repeat _10__ times each leg. Do _2__ times a day.   Bracing With Heel Slides (Supine)    Breathe IN.  While breathing OUT- tighten pelvic floor and abdominals and hold. Slide one heel to bottom and back up.  Repeat sequence with other leg. Repeat _10__ times each leg. Do _2__ times a day.
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
## 2017-06-08 ENCOUNTER — Encounter: Payer: Self-pay | Admitting: Endocrinology

## 2017-06-08 ENCOUNTER — Ambulatory Visit (INDEPENDENT_AMBULATORY_CARE_PROVIDER_SITE_OTHER): Payer: BLUE CROSS/BLUE SHIELD | Admitting: Endocrinology

## 2017-06-08 VITALS — BP 104/70 | HR 86 | Ht 68.0 in | Wt 179.4 lb

## 2017-06-08 DIAGNOSIS — E063 Autoimmune thyroiditis: Secondary | ICD-10-CM

## 2017-06-08 MED ORDER — LIOTHYRONINE SODIUM 5 MCG PO TABS
ORAL_TABLET | ORAL | 3 refills | Status: DC
Start: 2017-06-08 — End: 2017-11-29

## 2017-06-08 MED ORDER — LEVOTHYROXINE SODIUM 125 MCG PO TABS: 125.0000 ug | ORAL_TABLET | Freq: Every day | ORAL | 1 refills | Status: DC

## 2017-06-08 NOTE — Patient Instructions (Signed)
Add extra 1/2 Cytomel in am

## 2017-06-08 NOTE — Progress Notes (Signed)
Patient ID: Emily Miles, female   DOB: 1981/05/09, 35 y.o.   MRN: [id number]           Referring Physician: Reola Miles  Reason for Appointment:  Hypothyroidism and weakness, new visit    History of Present Illness:   The patient has been on thyroid supplements since 2001 Initially apparently she was told to take thyroid supplement because of having a thyroid nodule or cyst She had hemithyroidectomy in 2002 for a thyroid nodule that was benign pathology  She had been continued on thyroid supplement subsequently and apparently was needing progressively higher doses of levothyroxine About 6 years ago patient was switched by an endocrinologist to Cytomel and levothyroxine At times she has had an increase of her thyroid medication made an when her thyroid levels are low she feels exhausted and has some weight gain but no other typical symptoms  She had been feeling fairly well this year until an episode of syncope in July. In April 2018 her TSH done by her PCP was 0.7  She has been taking 125 g of levothyroxine since 7/18 when her TSH was high  RECENT HISTORY:  On her initial consultation in 8/18 although she was feeling weak, tired and difficulty with staying up as well as weight gain her thyroid levels were normal and her doses were continued unchanged She apparently has some leg muscle weakness and is improving with some physical therapy  She does still feel tired in the afternoon or evenings but overall feels a little better         Patient's weight history is as follows:  Wt Readings from Last 3 Encounters:  06/08/17 179 lb 6.4 oz (81.4 kg)  06/02/17 179 lb 3.2 oz (81.3 kg)  04/29/17 176 lb 8 oz (80.1 kg)    Thyroid function results have been as follows:  Lab Results  Component Value Date   TSH 2.50 06/02/2017   TSH 2.89 04/22/2017   TSH 7.890 (H) 03/09/2017   TSH 6.067 (H) 03/06/2017   FREET4 0.97 06/02/2017   FREET4 0.99 04/22/2017   FREET4 0.63 03/09/2017   T3FREE  2.8 06/02/2017   T3FREE 3.4 04/22/2017    Lab Results  Component Value Date   TSH 2.50 06/02/2017   TSH 2.89 04/22/2017   TSH 7.890 (H) 03/09/2017   TSH 6.067 (H) 03/06/2017      Past Medical History:  Diagnosis Date  . Panic attacks   . Thyroid disease   . Vision abnormalities     Past Surgical History:  Procedure Laterality Date  . BACK SURGERY    . CHOLECYSTECTOMY    . Hemithyroidectomy      Family History  Problem Relation Age of Onset  . Diabetes Mother   . Hypothyroidism Mother   . Diverticulitis Father     Social History:  reports that she has never smoked. She has never used smokeless tobacco. She reports that she drinks alcohol. She reports that she does not use drugs.  Allergies:  Allergies  Allergen Reactions  . Percocet [Oxycodone-Acetaminophen]     Allergies as of 06/08/2017      Reactions   Percocet [oxycodone-acetaminophen]       Medication List       Accurate as of 06/08/17  9:20 AM. Always use your most recent med list.          buPROPion 300 MG 24 hr tablet Commonly known as:  WELLBUTRIN XL Take 300 mg by mouth daily.   cetirizine 10  MG tablet Commonly known as:  ZYRTEC Take 10 mg by mouth daily.   etodolac 400 MG tablet Commonly known as:  LODINE Take 1 tablet (400 mg total) by mouth 2 (two) times daily.   levothyroxine 125 MCG tablet Commonly known as:  SYNTHROID Take 1 tablet (125 mcg total) by mouth daily before breakfast.   liothyronine 5 MCG tablet Commonly known as:  CYTOMEL Take 5 mcg by mouth 2 (two) times daily.   Melatonin 5 MG Tabs Take 1 tablet by mouth daily. Takes at night   NUVARING 0.12-0.015 MG/24HR vaginal ring Generic drug:  etonogestrel-ethinyl estradiol Place 1 each vaginally every 28 (twenty-eight) days.   PROBIOTIC ACIDOPHILUS PO Take 1 capsule by mouth daily. Takes in the morning   traZODone 50 MG tablet Commonly known as:  DESYREL Take 50 mg by mouth at bedtime.        Review of  Systems   B12 repeated recently normal   Now taking Wellbutrin for depression              Examination:    BP 104/70   Pulse 86   Ht  (1.727 m)   Wt 179 lb 6.4 oz (81.4 kg)   SpO2 97%   BMI 27.28 kg/m      Assessment:  HYPOTHYROIDISM, likely autoimmune with long history of thyroid supplementation No history of goiter on exam  She is on a consistent dose of Synthroid 125 g and liothyronine 5 g twice a day Her treatment was continued unchanged on her initial consultation since her labs were okay and she will having other medical issues causing her fatigue and weakness  She does think that she gets tired although not as much and she wants to increase her dose Discussed that her TSH is quite normal and consistent She only has some room for improvement of her T3 level which is slightly lower than before    PLAN:   She will use a pill cutter and add an extra 2.5 g of liothyronine in the morning Her TSH is trending higher she may need a higher dose but given her previous history of increased anxiety with 10 g twice a day of liothyronine will not increase her dose significantly No change in levothyroxine 125 g  Follow-up in 3 months   Emily Miles 06/08/2017, 9:20 AM    Note: This office note was prepared with Insurance underwriter. Any transcriptional errors that result from this process are unintentional.

## 2017-06-09 ENCOUNTER — Ambulatory Visit: Payer: BLUE CROSS/BLUE SHIELD | Admitting: Physical Therapy

## 2017-06-09 ENCOUNTER — Encounter: Payer: Self-pay | Admitting: Physical Therapy

## 2017-06-09 DIAGNOSIS — R262 Difficulty in walking, not elsewhere classified: Secondary | ICD-10-CM

## 2017-06-09 DIAGNOSIS — M79605 Pain in left leg: Secondary | ICD-10-CM

## 2017-06-09 DIAGNOSIS — R208 Other disturbances of skin sensation: Secondary | ICD-10-CM

## 2017-06-09 DIAGNOSIS — M6281 Muscle weakness (generalized): Secondary | ICD-10-CM | POA: Diagnosis not present

## 2017-06-09 NOTE — Patient Instructions (Addendum)
Angry Cat, All Fours    Kneel on hands and knees. Tuck chin and tighten stomach. Exhale and round back upward. Inhale and arch back downward. Hold each position ___ seconds. Repeat ___ times per session. Do ___ sessions per day.  BACK: Hip Flexor Stretch    Interlace fingers on top of right knee. Shift weight forward. Continue breathing normally and hold position for ___ breaths. Repeat on other leg. Alternate sides ___ times. Do ___ times per day.  Half-Kneeling    Half-kneeling, HEEL on the FLOOR (not roller). Straighten front leg, tilting pelvis back. Lean backward on kneeling leg. Keep spine straight. Hold _20-30__ seconds. Repeat _2__ times per leg. Do _2__ sessions per day.  BACK: Child's Pose (Sciatica)    Sit in knee-chest position and reach arms forward. Separate knees for comfort. Hold position for ___ breaths. Repeat ___ times. Do ___ times per day. Side Waist Stretch from Child's Pose    From child's pose, walk hands to left. Reach right hand out on diagonal. Reach hips back toward heels making a C with torso. Breathe into right side waist. Hold for ____ breaths. Repeat ____ times each side.   High lunge:    Start in kneeling: step right foot forwards- feet facing forward. Push up so there is a bend in the right knee and extend left leg behind, heel elevated. Distribute weight equally between front foot and ball of back foot.  Keep hands on edge of couch and work towards lifting your trunk up.  Work towards bringing each hand to rest on your knee.    Hold for _2-3__ breaths, place hands back on couch and slowly lower back knee.  Return to kneeling.   Repeat with other leg forward. Repeat _2-3__ times, alternating legs.   Copyright  VHI. All rights reserved.
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
## 2017-06-11 ENCOUNTER — Ambulatory Visit: Payer: BLUE CROSS/BLUE SHIELD | Admitting: Physical Therapy

## 2017-06-14 ENCOUNTER — Ambulatory Visit: Payer: BLUE CROSS/BLUE SHIELD | Admitting: Physical Therapy

## 2017-06-16 ENCOUNTER — Encounter: Payer: Self-pay | Admitting: Physical Medicine & Rehabilitation

## 2017-06-16 ENCOUNTER — Ambulatory Visit: Payer: BLUE CROSS/BLUE SHIELD | Admitting: Physical Therapy

## 2017-06-21 ENCOUNTER — Ambulatory Visit: Payer: BLUE CROSS/BLUE SHIELD | Admitting: Physical Therapy

## 2017-06-21 ENCOUNTER — Encounter: Payer: Self-pay | Admitting: Physical Therapy

## 2017-06-21 DIAGNOSIS — M6281 Muscle weakness (generalized): Secondary | ICD-10-CM | POA: Diagnosis not present

## 2017-06-21 DIAGNOSIS — R208 Other disturbances of skin sensation: Secondary | ICD-10-CM

## 2017-06-21 DIAGNOSIS — M79605 Pain in left leg: Secondary | ICD-10-CM

## 2017-06-21 DIAGNOSIS — R262 Difficulty in walking, not elsewhere classified: Secondary | ICD-10-CM
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
## 2017-06-23 ENCOUNTER — Ambulatory Visit: Payer: BLUE CROSS/BLUE SHIELD | Admitting: Physical Therapy

## 2017-06-23 ENCOUNTER — Encounter: Payer: Self-pay | Admitting: Physical Therapy

## 2017-06-23 DIAGNOSIS — M6281 Muscle weakness (generalized): Secondary | ICD-10-CM | POA: Diagnosis not present

## 2017-06-23 DIAGNOSIS — R208 Other disturbances of skin sensation: Secondary | ICD-10-CM

## 2017-06-23 DIAGNOSIS — M79605 Pain in left leg: Secondary | ICD-10-CM

## 2017-06-23 DIAGNOSIS — R262 Difficulty in walking, not elsewhere classified: Secondary | ICD-10-CM
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
## 2017-06-28 ENCOUNTER — Encounter: Payer: Self-pay | Admitting: Physical Medicine & Rehabilitation

## 2017-06-28 ENCOUNTER — Encounter: Payer: BLUE CROSS/BLUE SHIELD | Attending: Physical Medicine & Rehabilitation

## 2017-06-28 ENCOUNTER — Ambulatory Visit: Payer: BLUE CROSS/BLUE SHIELD | Admitting: Physical Therapy

## 2017-06-28 ENCOUNTER — Ambulatory Visit (HOSPITAL_BASED_OUTPATIENT_CLINIC_OR_DEPARTMENT_OTHER): Payer: BLUE CROSS/BLUE SHIELD | Admitting: Physical Medicine & Rehabilitation

## 2017-06-28 VITALS — BP 117/78 | HR 83

## 2017-06-28 DIAGNOSIS — M533 Sacrococcygeal disorders, not elsewhere classified: Secondary | ICD-10-CM | POA: Diagnosis not present

## 2017-06-28 DIAGNOSIS — F418 Other specified anxiety disorders: Secondary | ICD-10-CM | POA: Insufficient documentation

## 2017-06-28 DIAGNOSIS — Z833 Family history of diabetes mellitus: Secondary | ICD-10-CM | POA: Diagnosis not present

## 2017-06-28 DIAGNOSIS — E079 Disorder of thyroid, unspecified: Secondary | ICD-10-CM | POA: Insufficient documentation

## 2017-06-28 DIAGNOSIS — Z981 Arthrodesis status: Secondary | ICD-10-CM | POA: Insufficient documentation

## 2017-06-28 DIAGNOSIS — Z9889 Other specified postprocedural states: Secondary | ICD-10-CM | POA: Insufficient documentation

## 2017-06-28 DIAGNOSIS — Z8379 Family history of other diseases of the digestive system: Secondary | ICD-10-CM | POA: Insufficient documentation

## 2017-06-28 DIAGNOSIS — H539 Unspecified visual disturbance: Secondary | ICD-10-CM | POA: Diagnosis not present

## 2017-06-28 DIAGNOSIS — Z9049 Acquired absence of other specified parts of digestive tract: Secondary | ICD-10-CM | POA: Insufficient documentation
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
## 2017-06-28 NOTE — Patient Instructions (Signed)
Make appt with Dr Epimenio FootSater to evaluate headaches and change in cognition  Have made referral to neuropsych, Dr. Kieth Brightlyodenbough will do an evaluation as well

## 2017-06-29 ENCOUNTER — Encounter: Payer: Self-pay | Admitting: Physical Therapy

## 2017-06-29 ENCOUNTER — Telehealth: Payer: Self-pay | Admitting: *Deleted

## 2017-06-29 ENCOUNTER — Ambulatory Visit: Payer: BLUE CROSS/BLUE SHIELD | Admitting: Physical Therapy

## 2017-06-29 DIAGNOSIS — M6281 Muscle weakness (generalized): Secondary | ICD-10-CM

## 2017-06-29 DIAGNOSIS — R262 Difficulty in walking, not elsewhere classified: Secondary | ICD-10-CM

## 2017-06-29 DIAGNOSIS — R208 Other disturbances of skin sensation: Secondary | ICD-10-CM

## 2017-06-29 DIAGNOSIS — M79605 Pain in left leg: Secondary | ICD-10-CM
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
## 2017-06-29 NOTE — Telephone Encounter (Signed)
Spoke with pt. and gave appt. for eval of h/a's./fim

## 2017-06-29 NOTE — Patient Instructions (Signed)
    Supine Lat Pull with Bridge   Lie down on your back with your knees bent. Breathe in and then while breathing out pull the bands down toward the table, keeping your arms straight, while lifting your hips off the table into a bridge. Return to the starting position and repeat 10 times.  Keep shoulders down away from ears.    Glute Bridge with Abduction- V bridges  NO BAND AROUND KNEES; HOLD BAND IN EACH HAND LIKE PREVIOUS EXERCISE  Lie on your back with your feet planted close together and your knees bent and apart. Breathe in and then while breathing out pull the bands down toward the table, keeping your arms straight, while lifting your hips off the table into a bridge. Return to the starting position and repeat 10 times.  Keep shoulders down away from ears

## 2017-06-29 NOTE — Telephone Encounter (Signed)
Patient stopped by the office. States she saw Dr. Wynn BankerKirsteins yesterday and he was going to send Dr. Epimenio FootSater a request for her to have an MRI of her head.  Please call.

## 2017-06-30 ENCOUNTER — Ambulatory Visit: Payer: BLUE CROSS/BLUE SHIELD | Admitting: Physical Therapy

## 2017-06-30 ENCOUNTER — Encounter: Payer: Self-pay | Admitting: Physical Therapy

## 2017-06-30 DIAGNOSIS — R208 Other disturbances of skin sensation: Secondary | ICD-10-CM

## 2017-06-30 DIAGNOSIS — M6281 Muscle weakness (generalized): Secondary | ICD-10-CM | POA: Diagnosis not present

## 2017-06-30 DIAGNOSIS — R262 Difficulty in walking, not elsewhere classified: Secondary | ICD-10-CM

## 2017-06-30 DIAGNOSIS — M79605 Pain in left leg: Secondary | ICD-10-CM
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
## 2017-07-05 ENCOUNTER — Ambulatory Visit: Payer: BLUE CROSS/BLUE SHIELD | Admitting: Physical Therapy

## 2017-07-06 ENCOUNTER — Telehealth: Payer: Self-pay | Admitting: Physical Medicine & Rehabilitation

## 2017-07-06 DIAGNOSIS — F411 Generalized anxiety disorder: Secondary | ICD-10-CM

## 2017-07-06 DIAGNOSIS — F32A Depression, unspecified: Secondary | ICD-10-CM

## 2017-07-06 DIAGNOSIS — F329 Major depressive disorder, single episode, unspecified: Secondary | ICD-10-CM

## 2017-07-06 NOTE — Telephone Encounter (Signed)
Please place order for neuropsych eval Dr Kieth Brightlyodenbough

## 2017-07-06 NOTE — Telephone Encounter (Signed)
Pt stated she was referred to Dr. Kieth Brightlyodenbough. There is no referral in the system. Please advise.

## 2017-07-07 ENCOUNTER — Ambulatory Visit: Payer: BLUE CROSS/BLUE SHIELD | Attending: Neurology | Admitting: Physical Therapy

## 2017-07-07 ENCOUNTER — Encounter: Payer: Self-pay | Admitting: Physical Therapy

## 2017-07-07 DIAGNOSIS — M79605 Pain in left leg: Secondary | ICD-10-CM

## 2017-07-07 DIAGNOSIS — M6281 Muscle weakness (generalized): Secondary | ICD-10-CM

## 2017-07-07 DIAGNOSIS — R208 Other disturbances of skin sensation: Secondary | ICD-10-CM | POA: Diagnosis present

## 2017-07-07 DIAGNOSIS — R262 Difficulty in walking, not elsewhere classified: Secondary | ICD-10-CM | POA: Diagnosis present

## 2017-07-07 NOTE — Telephone Encounter (Signed)
As per my note anxiety and depression is reason for referral to Neuropsych

## 2017-07-07 NOTE — Telephone Encounter (Signed)
Done

## 2017-07-07 NOTE — Telephone Encounter (Signed)
Attempting to make referral for patient for neuropsychic, what is the diagnosses for this referral?

## 2017-07-07 NOTE — Telephone Encounter (Signed)
Pt phoned to schedule appointment. Referral was created and diagnosis was obtained from chart review. Pt was scheduled with Dr. Kieth Brightlyodenbough.

## 2017-07-07 NOTE — Patient Instructions (Addendum)
WALKING  Walking is a great form of exercise to increase your strength, endurance and overall fitness.  A walking program can help you start slowly and gradually build endurance as you go.  Everyone's ability is different, so each person's starting point will be different.  You do not have to follow them exactly.  The are just samples. You should simply find out what's right for you and stick to that program.   In the beginning, you'll start off walking 2-3 times a day for short distances.  As you get stronger, you'll be walking further at just 1-2 times per day.  A. You Can Walk For A Certain Length Of Time Each Day    Walk 5 minutes 2 times per week.; 2.0 mph  Increase 3 minutes every week.  Work up to 30 minutes (2-3 times per week).   Example:   Week 1 5 minutes 2 times per week   Week 2 8 minutes 2 times per week   Week 3 11 minutes 2 times per week   Week 4 14 minutes  2 times per week   Week 5  17 minutes 2 times per week   Week 6 20 minutes 2-3 times per week   Week 7 23 minutes 2-3 times per week   Week 8 26 minutes 2-3 times per week   Week 9 29-30 minutes 2-3 time per week  If you reach 30 minutes quickly, can increase speed to 2.2 or 2.3 mph or increase incline by 1%    [facility] Pose    From hands and knees, slide right leg back and turn bent left leg out slightly to side. Resting weight on outside of left leg, push up torso with arms. Hold for __10__ breaths. Repeat on other side.
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
## 2017-07-09 ENCOUNTER — Encounter: Payer: Self-pay | Admitting: Physical Therapy

## 2017-07-09 ENCOUNTER — Ambulatory Visit: Payer: BLUE CROSS/BLUE SHIELD | Admitting: Physical Therapy

## 2017-07-09 DIAGNOSIS — M6281 Muscle weakness (generalized): Secondary | ICD-10-CM | POA: Diagnosis not present

## 2017-07-09 DIAGNOSIS — R208 Other disturbances of skin sensation: Secondary | ICD-10-CM

## 2017-07-09 DIAGNOSIS — R262 Difficulty in walking, not elsewhere classified: Secondary | ICD-10-CM

## 2017-07-09 DIAGNOSIS — M79605 Pain in left leg: Secondary | ICD-10-CM
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
## 2017-07-12 ENCOUNTER — Ambulatory Visit: Payer: BLUE CROSS/BLUE SHIELD | Admitting: Physical Therapy

## 2017-07-14 ENCOUNTER — Encounter: Payer: Self-pay | Admitting: Physical Therapy

## 2017-07-14 ENCOUNTER — Ambulatory Visit: Payer: BLUE CROSS/BLUE SHIELD | Admitting: Physical Therapy

## 2017-07-14 DIAGNOSIS — M79605 Pain in left leg: Secondary | ICD-10-CM

## 2017-07-14 DIAGNOSIS — R208 Other disturbances of skin sensation: Secondary | ICD-10-CM

## 2017-07-14 DIAGNOSIS — R262 Difficulty in walking, not elsewhere classified: Secondary | ICD-10-CM

## 2017-07-14 DIAGNOSIS — M6281 Muscle weakness (generalized): Secondary | ICD-10-CM
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
## 2017-07-15 ENCOUNTER — Telehealth: Payer: Self-pay | Admitting: Neurology

## 2017-07-15 NOTE — Telephone Encounter (Signed)
I can see her on Monday for headaches.   There is an opening at 9:00. Or in the afternoon.mis

## 2017-07-15 NOTE — Telephone Encounter (Signed)
Dr Moss Mcodd Beck/[facility] [phone](o) (if he doesn't answer please call the RN line [phone], they will pull him from a room) called request Dr Epimenio FootSater to call him at your convenience. He said pt has already seen Dr Wynn BankerKirsteins on 10/29 and to take a look at that before calling. Thank you

## 2017-07-16 NOTE — Telephone Encounter (Signed)
Spoke with Dartha LodgeJennifer--she will come in at 0930 (arrival time 0915)on Monday 07/19/17/fim

## 2017-07-19 ENCOUNTER — Ambulatory Visit (INDEPENDENT_AMBULATORY_CARE_PROVIDER_SITE_OTHER): Payer: BLUE CROSS/BLUE SHIELD | Admitting: Neurology

## 2017-07-19 ENCOUNTER — Ambulatory Visit: Payer: BLUE CROSS/BLUE SHIELD | Admitting: Physical Therapy

## 2017-07-19 ENCOUNTER — Encounter: Payer: Self-pay | Admitting: Neurology

## 2017-07-19 ENCOUNTER — Other Ambulatory Visit: Payer: Self-pay

## 2017-07-19 VITALS — BP 119/78 | HR 80 | Resp 16 | Ht 68.0 in | Wt 180.0 lb

## 2017-07-19 DIAGNOSIS — R519 Headache, unspecified: Secondary | ICD-10-CM | POA: Insufficient documentation

## 2017-07-19 DIAGNOSIS — R29898 Other symptoms and signs involving the musculoskeletal system: Secondary | ICD-10-CM | POA: Diagnosis not present

## 2017-07-19 DIAGNOSIS — M542 Cervicalgia: Secondary | ICD-10-CM | POA: Diagnosis not present

## 2017-07-19 DIAGNOSIS — M5481 Occipital neuralgia: Secondary | ICD-10-CM | POA: Diagnosis not present

## 2017-07-19 DIAGNOSIS — R51 Headache: Secondary | ICD-10-CM

## 2017-07-19 DIAGNOSIS — M545 Low back pain, unspecified: Secondary | ICD-10-CM

## 2017-07-19 MED ORDER — IMIPRAMINE HCL 25 MG PO TABS: ORAL_TABLET | ORAL | 5 refills | Status: DC
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
## 2017-07-21 ENCOUNTER — Other Ambulatory Visit: Payer: Self-pay | Admitting: Neurology

## 2017-07-21 DIAGNOSIS — R29898 Other symptoms and signs involving the musculoskeletal system: Secondary | ICD-10-CM

## 2017-07-21 DIAGNOSIS — M6281 Muscle weakness (generalized): Secondary | ICD-10-CM

## 2017-07-21 DIAGNOSIS — R51 Headache: Secondary | ICD-10-CM

## 2017-07-21 DIAGNOSIS — R519 Headache, unspecified: Secondary | ICD-10-CM

## 2017-07-21 NOTE — Telephone Encounter (Signed)
Pt called HA has not fully gone away yet. It is better but she is still feeling pressure behind the left eye. Last night she had shooting pain behind the left eye but not she has woke up this am. Pt said she is not sleeping well withimipramine (TOFRANIL) 25 MG tablet .Will it take awhile to kick in? Please call to advise at [phone]

## 2017-07-21 NOTE — Telephone Encounter (Signed)
Spoke with DIRECTVJennifer. She reports h/a improved but not completely resolved. Per RAS, ok for MRI.  He has put the order in. Pt. agreeable with this plan/fim

## 2017-07-26 ENCOUNTER — Other Ambulatory Visit: Payer: Self-pay

## 2017-07-26 ENCOUNTER — Encounter: Payer: BLUE CROSS/BLUE SHIELD | Attending: Physical Medicine & Rehabilitation

## 2017-07-26 ENCOUNTER — Ambulatory Visit: Payer: BLUE CROSS/BLUE SHIELD | Admitting: Physical Medicine & Rehabilitation

## 2017-07-26 ENCOUNTER — Encounter: Payer: Self-pay | Admitting: Physical Medicine & Rehabilitation

## 2017-07-26 DIAGNOSIS — Z9049 Acquired absence of other specified parts of digestive tract: Secondary | ICD-10-CM | POA: Insufficient documentation

## 2017-07-26 DIAGNOSIS — E079 Disorder of thyroid, unspecified: Secondary | ICD-10-CM | POA: Insufficient documentation

## 2017-07-26 DIAGNOSIS — Z833 Family history of diabetes mellitus: Secondary | ICD-10-CM | POA: Insufficient documentation

## 2017-07-26 DIAGNOSIS — Z8379 Family history of other diseases of the digestive system: Secondary | ICD-10-CM | POA: Diagnosis not present

## 2017-07-26 DIAGNOSIS — H539 Unspecified visual disturbance: Secondary | ICD-10-CM | POA: Diagnosis not present

## 2017-07-26 DIAGNOSIS — Z9889 Other specified postprocedural states: Secondary | ICD-10-CM | POA: Diagnosis not present

## 2017-07-26 DIAGNOSIS — F418 Other specified anxiety disorders: Secondary | ICD-10-CM | POA: Insufficient documentation

## 2017-07-26 DIAGNOSIS — Z981 Arthrodesis status: Secondary | ICD-10-CM | POA: Diagnosis not present

## 2017-07-26 DIAGNOSIS — M533 Sacrococcygeal disorders, not elsewhere classified: Secondary | ICD-10-CM | POA: Insufficient documentation

## 2017-07-26 NOTE — Progress Notes (Signed)
Coccygeal injection under fluoroscopic guidance Indication is coccydynia which does not respond to medication management and other conservative care.  Pain inhibits sitting Informed consent was obtained after describing risks and benefits of the procedure with the patient which include bleeding bruising and infection.  She elects to proceed and has given written consent. Patient placed prone on fluoroscopy table Betadine prep and sterile drape most tender area was marked and fluoroscopic marker was placed over that area.  A 25-gauge 1.5 inch needle was used to anesthetize the skin and subcu tissue with 1% lidocaine times 3 cc needle was advanced to bone contact under AP and lateral imaging.  Omnipaque 180 under lateral views showed good spread along the coccyx.  Needle tip was over the third coccygeal bone. Then a solution containing 1 cc of 6 mg/cc Celestone and 4 cc of 2% lidocaine were injected.  Patient tolerated procedure well.  Postprocedure instructions given.

## 2017-07-26 NOTE — Progress Notes (Signed)
  PROCEDURE RECORD [facility]   Name: Emily Miles DOB:[birth year] MRN: [id number]  Date:07/26/2017  Physician: Claudette LawsAndrew Kirsteins, MD    Nurse/CMA: Yuriel Lopezmartinez, CMA  Allergies:  Allergies  Allergen Reactions  . Percocet [Oxycodone-Acetaminophen]     Consent Signed: Yes.    Is patient diabetic? No.  CBG today?   Pregnant: No. LMP: No LMP recorded. Patient has had an implant. (age 38-55)  Anticoagulants: no Anti-inflammatory: no Antibiotics: no  Procedure: coccyx steroid injection  Position: Prone Start Time: 10:02am   End Time: 10:05am  Fluoro Time: 12  RN/CMA Deontae Robson, CMA Carron Mcmurry, CMA    Time 9:30am 10:09am    BP 116/81 114/78    Pulse 96 101    Respirations 16 16    O2 Sat 97 97    S/S 6 6    Pain Level 4/10 2/10     D/C home with [name], patient A & O X 3, D/C instructions reviewed, and sits independently.

## 2017-07-26 NOTE — Patient Instructions (Signed)
We discussed several pressure relief cushions for tailbone pain  Injected celestone and lidocaine to coccyx

## 2017-07-28 ENCOUNTER — Ambulatory Visit: Payer: BLUE CROSS/BLUE SHIELD

## 2017-07-28 DIAGNOSIS — M6281 Muscle weakness (generalized): Secondary | ICD-10-CM

## 2017-07-28 MED ORDER — GADOPENTETATE DIMEGLUMINE 469.01 MG/ML IV SOLN: 15.0000 mL | Freq: Once | INTRAVENOUS | Status: AC | PRN

## 2017-07-30 ENCOUNTER — Ambulatory Visit: Payer: BLUE CROSS/BLUE SHIELD | Admitting: Physical Therapy

## 2017-07-30 ENCOUNTER — Telehealth: Payer: Self-pay | Admitting: *Deleted

## 2017-07-30 ENCOUNTER — Encounter: Payer: Self-pay | Admitting: Physical Therapy

## 2017-07-30 DIAGNOSIS — M79605 Pain in left leg: Secondary | ICD-10-CM

## 2017-07-30 DIAGNOSIS — R262 Difficulty in walking, not elsewhere classified: Secondary | ICD-10-CM

## 2017-07-30 DIAGNOSIS — M6281 Muscle weakness (generalized): Secondary | ICD-10-CM

## 2017-07-30 DIAGNOSIS — R208 Other disturbances of skin sensation: Secondary | ICD-10-CM
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
## 2017-07-30 NOTE — Telephone Encounter (Signed)
LMOM (identified vm) that per RAS, MRI is normal.  She does not need to return this call unless she has questions/fim

## 2017-07-30 NOTE — Telephone Encounter (Signed)
-----   Message from Asa Lenteichard A Sater, MD sent at 07/30/2017 11:50 AM EST ----- Please let the patient know that the MRI is normal

## 2017-08-01 NOTE — Addendum Note (Signed)
Addended by: Bufford LopePOTTER, Jozalyn Baglio F on: 08/01/2017 02:10 PM   Modules accepted: Orders

## 2017-08-02 ENCOUNTER — Ambulatory Visit: Payer: Self-pay | Admitting: Neurology

## 2017-08-04 ENCOUNTER — Encounter: Payer: Self-pay | Admitting: Physical Therapy

## 2017-08-04 ENCOUNTER — Ambulatory Visit: Payer: BLUE CROSS/BLUE SHIELD | Attending: Neurology | Admitting: Physical Therapy

## 2017-08-04 DIAGNOSIS — R208 Other disturbances of skin sensation: Secondary | ICD-10-CM | POA: Diagnosis present

## 2017-08-04 DIAGNOSIS — M6281 Muscle weakness (generalized): Secondary | ICD-10-CM | POA: Diagnosis present

## 2017-08-04 DIAGNOSIS — M79605 Pain in left leg: Secondary | ICD-10-CM | POA: Diagnosis present

## 2017-08-04 DIAGNOSIS — R262 Difficulty in walking, not elsewhere classified: Secondary | ICD-10-CM
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
## 2017-08-06 ENCOUNTER — Encounter: Payer: Self-pay | Admitting: Physical Therapy

## 2017-08-06 ENCOUNTER — Ambulatory Visit: Payer: BLUE CROSS/BLUE SHIELD | Admitting: Physical Therapy

## 2017-08-06 DIAGNOSIS — R262 Difficulty in walking, not elsewhere classified: Secondary | ICD-10-CM

## 2017-08-06 DIAGNOSIS — M6281 Muscle weakness (generalized): Secondary | ICD-10-CM

## 2017-08-06 DIAGNOSIS — M79605 Pain in left leg: Secondary | ICD-10-CM

## 2017-08-06 DIAGNOSIS — R208 Other disturbances of skin sensation: Secondary | ICD-10-CM
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
## 2017-08-18 ENCOUNTER — Ambulatory Visit: Payer: BLUE CROSS/BLUE SHIELD | Admitting: Physical Therapy

## 2017-09-15 ENCOUNTER — Encounter: Payer: Self-pay | Admitting: Physical Therapy

## 2017-09-15 ENCOUNTER — Ambulatory Visit: Payer: BLUE CROSS/BLUE SHIELD | Attending: Neurology | Admitting: Physical Therapy

## 2017-09-15 DIAGNOSIS — R262 Difficulty in walking, not elsewhere classified: Secondary | ICD-10-CM | POA: Diagnosis present

## 2017-09-15 DIAGNOSIS — M6281 Muscle weakness (generalized): Secondary | ICD-10-CM | POA: Insufficient documentation

## 2017-09-15 DIAGNOSIS — R208 Other disturbances of skin sensation: Secondary | ICD-10-CM | POA: Diagnosis present

## 2017-09-15 DIAGNOSIS — M79605 Pain in left leg: Secondary | ICD-10-CM
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
## 2017-09-15 NOTE — Patient Instructions (Signed)
Quadratus Lumborum: Wall Stretch (Sitting)    Sit in corner. Put right foot in groin with other leg straight. Put right hand on wall or lift right hand overhead. Rotate body and lean toward outstretched leg until stretch is felt between hips and ribs. Hold _30__ seconds. Relax. Repeat with other leg.  Copyright  VHI. All rights reserved.  Side Waist Stretch from Child's Pose    From child's pose, walk hands to left. Reach right hand out on diagonal. Reach hips back toward heels making a C with torso. Breathe into right side waist. Hold for __10__ breaths. Repeat __1-2__ times each side.

## 2017-09-16 ENCOUNTER — Encounter: Payer: Self-pay | Admitting: Psychology

## 2017-09-16 ENCOUNTER — Encounter: Payer: BLUE CROSS/BLUE SHIELD | Attending: Physical Medicine & Rehabilitation | Admitting: Psychology

## 2017-09-16 DIAGNOSIS — Z833 Family history of diabetes mellitus: Secondary | ICD-10-CM | POA: Diagnosis not present

## 2017-09-16 DIAGNOSIS — Z8379 Family history of other diseases of the digestive system: Secondary | ICD-10-CM | POA: Diagnosis not present

## 2017-09-16 DIAGNOSIS — F32A Depression, unspecified: Secondary | ICD-10-CM

## 2017-09-16 DIAGNOSIS — E079 Disorder of thyroid, unspecified: Secondary | ICD-10-CM | POA: Diagnosis not present

## 2017-09-16 DIAGNOSIS — Z981 Arthrodesis status: Secondary | ICD-10-CM | POA: Insufficient documentation

## 2017-09-16 DIAGNOSIS — F4310 Post-traumatic stress disorder, unspecified: Secondary | ICD-10-CM | POA: Diagnosis not present

## 2017-09-16 DIAGNOSIS — Z9049 Acquired absence of other specified parts of digestive tract: Secondary | ICD-10-CM | POA: Insufficient documentation

## 2017-09-16 DIAGNOSIS — Z9889 Other specified postprocedural states: Secondary | ICD-10-CM | POA: Insufficient documentation

## 2017-09-16 DIAGNOSIS — F329 Major depressive disorder, single episode, unspecified: Secondary | ICD-10-CM

## 2017-09-16 DIAGNOSIS — H539 Unspecified visual disturbance: Secondary | ICD-10-CM | POA: Diagnosis not present

## 2017-09-16 DIAGNOSIS — M533 Sacrococcygeal disorders, not elsewhere classified: Secondary | ICD-10-CM | POA: Diagnosis not present

## 2017-09-16 DIAGNOSIS — S069X1A Unspecified intracranial injury with loss of consciousness of 30 minutes or less, initial encounter: Secondary | ICD-10-CM | POA: Diagnosis not present

## 2017-09-16 DIAGNOSIS — F418 Other specified anxiety disorders: Secondary | ICD-10-CM | POA: Insufficient documentation
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
## 2017-09-17 ENCOUNTER — Ambulatory Visit: Payer: BLUE CROSS/BLUE SHIELD | Admitting: Physical Therapy

## 2017-09-21 ENCOUNTER — Ambulatory Visit: Payer: BLUE CROSS/BLUE SHIELD | Admitting: Physical Medicine & Rehabilitation

## 2017-09-22 ENCOUNTER — Ambulatory Visit: Payer: BLUE CROSS/BLUE SHIELD | Admitting: Physical Therapy

## 2017-09-24 ENCOUNTER — Ambulatory Visit: Payer: BLUE CROSS/BLUE SHIELD | Admitting: Rehabilitative and Restorative Service Providers"

## 2017-09-24 ENCOUNTER — Encounter: Payer: Self-pay | Admitting: Rehabilitative and Restorative Service Providers"

## 2017-09-24 DIAGNOSIS — M6281 Muscle weakness (generalized): Secondary | ICD-10-CM | POA: Diagnosis not present

## 2017-09-24 DIAGNOSIS — R262 Difficulty in walking, not elsewhere classified: Secondary | ICD-10-CM
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
## 2017-09-27 ENCOUNTER — Encounter: Payer: Self-pay | Admitting: Physical Therapy

## 2017-09-27 ENCOUNTER — Ambulatory Visit: Payer: BLUE CROSS/BLUE SHIELD | Admitting: Physical Therapy

## 2017-09-27 DIAGNOSIS — R262 Difficulty in walking, not elsewhere classified: Secondary | ICD-10-CM

## 2017-09-27 DIAGNOSIS — M6281 Muscle weakness (generalized): Secondary | ICD-10-CM

## 2017-09-27 DIAGNOSIS — R208 Other disturbances of skin sensation: Secondary | ICD-10-CM

## 2017-09-27 DIAGNOSIS — M79605 Pain in left leg: Secondary | ICD-10-CM
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
## 2017-09-29 ENCOUNTER — Ambulatory Visit: Payer: BLUE CROSS/BLUE SHIELD | Admitting: Physical Therapy

## 2017-09-29 ENCOUNTER — Encounter: Payer: Self-pay | Admitting: Physical Therapy

## 2017-09-29 DIAGNOSIS — M79605 Pain in left leg: Secondary | ICD-10-CM

## 2017-09-29 DIAGNOSIS — M6281 Muscle weakness (generalized): Secondary | ICD-10-CM

## 2017-09-29 DIAGNOSIS — R208 Other disturbances of skin sensation: Secondary | ICD-10-CM

## 2017-09-29 DIAGNOSIS — R262 Difficulty in walking, not elsewhere classified: Secondary | ICD-10-CM
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
## 2017-10-06 ENCOUNTER — Ambulatory Visit: Payer: BLUE CROSS/BLUE SHIELD | Attending: Neurology | Admitting: Physical Therapy

## 2017-10-06 ENCOUNTER — Encounter: Payer: Self-pay | Admitting: Physical Therapy

## 2017-10-06 VITALS — BP 114/81 | HR 90

## 2017-10-06 DIAGNOSIS — M6281 Muscle weakness (generalized): Secondary | ICD-10-CM | POA: Insufficient documentation

## 2017-10-06 DIAGNOSIS — R262 Difficulty in walking, not elsewhere classified: Secondary | ICD-10-CM | POA: Diagnosis present

## 2017-10-06 DIAGNOSIS — R208 Other disturbances of skin sensation: Secondary | ICD-10-CM
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
## 2017-10-08 ENCOUNTER — Ambulatory Visit: Payer: BLUE CROSS/BLUE SHIELD | Admitting: Physical Therapy

## 2017-10-08 ENCOUNTER — Ambulatory Visit: Payer: BLUE CROSS/BLUE SHIELD | Admitting: Cardiology

## 2017-10-08 ENCOUNTER — Encounter: Payer: Self-pay | Admitting: Cardiology

## 2017-10-08 VITALS — BP 74/40 | HR 116 | Ht 68.0 in | Wt 170.6 lb

## 2017-10-08 DIAGNOSIS — R55 Syncope and collapse: Secondary | ICD-10-CM | POA: Diagnosis not present

## 2017-10-08 DIAGNOSIS — I959 Hypotension, unspecified: Secondary | ICD-10-CM

## 2017-10-08 NOTE — Progress Notes (Signed)
Cardiology Office Note:    Date:  10/08/2017   ID:  Emily Miles, DOB [birth year], MRN [id number]  PCP:  Blenda Mounts, MD  Cardiologist:  Norman Herrlich, MD   Referring MD: Blenda Mounts, MD  ASSESSMENT:    1. Syncope, unspecified syncope type   2. Hypotension, unspecified hypotension type    PLAN:    In order of problems listed above:  1. Her presentation is significant with hypotension autonomic dysfunction and neurocardiogenic syncope.  The approach includes excluding other heart disease echocardiogram event monitor.  Reassurance is helpful.  To mitigate episodes I asked her to add salt to her diet to try to use over-the-counter coated salt tablets 2 a day drink adequate liquids and avoid prolonged immobilization.  To avoid pulling she will use an abdominal binder she does not want to use support hose.  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  If she had not been seen by neurology have referred her for evaluation of neurologic etiologies of loss of consciousness but I do not think it is needed at this time. 2. Discontinue tricyclic antidepressant add salt tablets sodium abdominal binder.  Next appointment in 2 weeks   Medication Adjustments/Labs and Tests Ordered: Current medicines are reviewed at length with the patient today.  Concerns regarding medicines are outlined above.  Orders Placed This Encounter  Procedures  . CARDIAC EVENT MONITOR  . EKG 12-Lead  . ECHOCARDIOGRAM COMPLETE   No orders of the defined types were placed in this encounter.    Chief Complaint  Patient presents with  . Referral    pt was referred by her pcp for a lot of fainting.    History of Present Illness:    Emily Miles is a 37 y.o. female who is being seen today for the evaluation of syncope at the request of Reola Calkins, [place],  MD. She has been seen by neurology recently. 1.    Left splenius capitis trigger point injection with 80 mg Depo-Medrol in [place]. She tolerated the procedure well and there were no complications. The neck/occiput pain improved after the shot but she continued to experience a similar level of pain near the eyes.    Toradol 60 mg IM was provided. 2.    Etodolac bid and imipramine.    3.    She is advised to stay active and exercises as tolerated. 4.    If pain does not improve, we will check an MRI  To rule out mass lesion, inflammatory process or ischemic process.  5.   She will return in 2-3 months or as needed.  We will call with the results of lab work and xray Richard A. Epimenio Foot, MD, [facility] 07/19/2017, 9:55 AM Certified in Neurology, Clinical Neurophysiology, Sleep Medicine, Pain Medicine and Neuroimaging She has also been seen by Endocrinology for hypothroidism  She is referred after having an episode of near syncope and classic [employer] yesterday.  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  EMS was called they did not put a monitor on her but the checked her heart rate and blood pressure which were normal factor blood pressure is 153/90 and she was seen  in student health afterwards.  She tells me that her blood pressure was normal. She has had similar episodes since the summer.  She relates a background history of traumatic brain injury and prolonged immobilization.  She is now starting to try an exercise program.  She has had previous episodes of frank syncope as well as near syncope and is having 08-3958-month always associated with prolonged immobilization and upright posture.  She has no background history of congenital or rheumatic heart disease.  She has been placed on a try cyclic antidepressant that has been helpful in alleviating headache.   She uses no over-the-counter proarrhythmic drugs.  She does not add salt to her diet she thinks that her sodium and water intake is otherwise normal and has had no nausea vomiting or diarrhea.  She did vomit yesterday with the episode of near syncope. She is seen in the office today her physical examination is unremarkable EKG is normal but she has reproducible weakness near syncope and a standing blood pressure of 74/40.  Her clinical syndrome is consistent with neurocardiogenic syncope or autonomic dysfunction and is influenced by the try cyclic antidepressant with alpha adrenergic blockade.  For further evaluation I have asked her to wear a 30-day event monitor and an echocardiogram to assure me she has no structural heart disease.  Her family history is unremarkable except for a grandfather who had heart disease late in life.  There is no family history of QT syndrome abnormality sudden death or cardiomyopathy.  Although she has had head trauma she has had no history of seizure disorder or pseudoseizure.  The episodes have been witnessed and had no ictal activity.  She does have palpitation where she is aware of her heart beating at night it feels rapid she has no documented arrhythmia no chest pain and has mild exertional dyspnea she is resumed exercise running on a treadmill.  Past Medical History:  Diagnosis Date  . Panic attacks   . Thyroid disease   . Vision abnormalities     Past Surgical History:  Procedure Laterality Date  . BACK SURGERY    . CERVICAL SPINE SURGERY    . CHOLECYSTECTOMY    . Hemithyroidectomy      Current Medications: Current Meds  Medication Sig  . buPROPion (WELLBUTRIN XL) 300 MG 24 hr tablet Take 300 mg by mouth daily.  Marland Kitchen. etodolac (LODINE) 400 MG tablet Take 1 tablet (400 mg total) by mouth 2 (two) times daily.  . Lactobacillus (PROBIOTIC ACIDOPHILUS PO) Take 1 capsule by mouth daily. Takes in the morning  . levothyroxine (SYNTHROID) 125 MCG tablet Take 1 tablet  (125 mcg total) by mouth daily before breakfast.  . liothyronine (CYTOMEL) 5 MCG tablet 1-1/2 tablets in the morning and 1 tablet at dinner  . Melatonin 5 MG TABS Take 1 tablet by mouth daily. Takes at night  . NUVARING 0.12-0.015 MG/24HR vaginal ring Place 1 each vaginally every 28 (twenty-eight) days.   . propranolol (INDERAL) 10 MG tablet   . traZODone (DESYREL) 50 MG tablet Take 100 mg by mouth at bedtime.   . [DISCONTINUED] imipramine (TOFRANIL) 25 MG tablet Take one or two atr night.     Allergies:   Percocet [oxycodone-acetaminophen]   Social History   Socioeconomic History  . Marital status: Single    Spouse name: None  . Number of children: None  . Years of education: None  . Highest education level: None  Social Needs  . Financial resource strain: None  .  Food insecurity - worry: None  . Food insecurity - inability: None  . Transportation needs - medical: None  . Transportation needs - non-medical: None  Occupational History  . None  Tobacco Use  . Smoking status: Never Smoker  . Smokeless tobacco: Never Used  Substance and Sexual Activity  . Alcohol use: Yes  . Drug use: No  . Sexual activity: None  Other Topics Concern  . None  Social History Narrative  . None     Family History: The patient's family history includes Diabetes in her mother; Diverticulitis in her father; Hypothyroidism in her mother.  ROS:   Review of Systems  Constitution: Positive for decreased appetite.  HENT: Positive for hearing loss.   Eyes: Positive for visual disturbance.  Cardiovascular: Positive for dyspnea on exertion (on the treadmill running), near-syncope and palpitations (rapid at night). Negative for chest pain, claudication, cyanosis, irregular heartbeat, leg swelling, orthopnea and paroxysmal nocturnal dyspnea.  Respiratory: Positive for shortness of breath (with running).   Endocrine: Negative.   Hematologic/Lymphatic: Negative.   Skin: Negative.   Musculoskeletal:  Positive for back pain and muscle weakness.  Gastrointestinal: Negative.   Genitourinary: Negative.   Neurological: Positive for loss of balance.  Psychiatric/Behavioral: Positive for depression.   Please see the history of present illness.     All other systems reviewed and are negative.  EKGs/Labs/Other Studies Reviewed:    The following studies were reviewed today [facility] ED records reviewed prior to visit, her PCP called and spoke to me   EKG:  EKG is  ordered today.  The ekg ordered today demonstrates [facility] and normal  MRI brain:Normal MRI brain (with and without). EKG 03/09/18 [facility] normal Recent Labs: CMP and CBC normal at [facility] 03/08/2017: Hemoglobin 12.1; Platelets 234 04/22/2017: ALT 14; BUN 15; Creatinine, Ser 0.84; Potassium 4.3; Sodium 138 06/02/2017: TSH 2.50  Recent Lipid Panel No results found for: CHOL, TRIG, HDL, CHOLHDL, VLDL, LDLCALC, LDLDIRECT  Physical Exam:    VS:  BP 118/76 (BP Location: Right Arm, Patient Position: Sitting, Cuff Size: Normal)   Pulse (!) 116   Ht 5\' 8"  (1.727 m)   Wt 170 lb 9.6 oz (77.4 kg)   SpO2 98%   BMI 25.94 kg/m     Wt Readings from Last 3 Encounters:  10/08/17 170 lb 9.6 oz (77.4 kg)  07/19/17 180 lb (81.6 kg)  06/08/17 179 lb 6.4 oz (81.4 kg)     GEN:  Well nourished, well developed in no acute distress HEENT: Normal NECK: No JVD; No carotid bruits LYMPHATICS: No lymphadenopathy CARDIAC: RRR, no murmurs, rubs, gallops RESPIRATORY:  Clear to auscultation without rales, wheezing or rhonchi  ABDOMEN: Soft, non-tender, non-distended MUSCULOSKELETAL:  No edema; No deformity  SKIN: Warm and dry NEUROLOGIC:  Alert and oriented x 3 PSYCHIATRIC:  Normal affect     Signed, Norman Herrlich, MD  10/08/2017 3:09 PM    [facility]

## 2017-10-08 NOTE — Patient Instructions (Addendum)
Medication Instructions:  Your physician has recommended you make the following change in your medication:  STOP imipramine  START salt tablets. Make sure you get coated. You can buy over the counter.  Labwork: None  Testing/Procedures: You had an EKG today.  Your physician has requested that you have an echocardiogram. Echocardiography is a painless test that uses sound waves to create images of your heart. It provides your doctor with information about the size and shape of your heart and how well your heart's chambers and valves are working. This procedure takes approximately one hour. There are no restrictions for this procedure.  Your physician has recommended that you wear an event monitor. Event monitors are medical devices that record the heart's electrical activity. Doctors most often us these monitors to diagnose arrhythmias. Arrhythmias are problems with the speed or rhythm of the heartbeat. The monitor is a small, portable device. You can wear one while you do your normal daily activities. This is usually used to diagnose what is causing palpitations/syncope (passing out). 30 days.  Follow-Up: Your physician recommends that you schedule a follow-up appointment in: 2 weeks.  Start salting your food.  Buy an abdominal binder.  Any Other Special Instructions Will Be Listed Below (If Applicable).     If you need a refill on your cardiac medications before your next appointment, please call your pharmacy.    1. Avoid all over-the-counter antihistamines except Claritin/Loratadine and Zyrtec/Cetrizine. 2. Avoid all combination including cold sinus allergies flu decongestant and sleep medications 3. You can use Robitussin DM Mucinex and Mucinex DM for cough. 4. can use Tylenol aspirin ibuprofen and naproxen but no combinations such as sleep or sinus.       Introducing KardiaBand, the new wearable EKG by [employer]. Venetia MaxonKardiaBand replaces your original Apple Watch band. The  first of its kind, FDA-cleared KardiaBand provides accurate and instant analysis for detecting atrial fibrillation (AF) and normal sinus rhythm in an EKG. Simply place your thumb on the integrated KardiaBand sensor to take a medical-grade EKG in just 30 seconds. Results appear instantly on your Apple Watch. Venetia MaxonKardiaBand is available today for just $199. KardiaBand features are designed exclusively for use with [employer] Premium membership - $99 year. The [employer]ardia Premium app for [employer] Watch includes [employer]'s revolutionary SmartRhythm monitoring feature. SmartRhythm monitoring uses an intelligent neural network that runs directly on the [employer] Watch, constantly acquiring data from the watch's heart rate sensor and its accelerometer. SmartRhythm compares your heart rate to what it expects from your minute-by-minute level of activity. When the network sees a pattern of heart rate and activity that it does not expect, it notifies you to take an EKG. With OdellKardiaBand, peace of mind is just an EKG away.  .Marland Kitchen

## 2017-10-11 ENCOUNTER — Ambulatory Visit: Payer: BLUE CROSS/BLUE SHIELD | Admitting: Physical Therapy

## 2017-10-13 ENCOUNTER — Ambulatory Visit: Payer: BLUE CROSS/BLUE SHIELD | Admitting: Neurology

## 2017-10-13 ENCOUNTER — Other Ambulatory Visit: Payer: Self-pay

## 2017-10-13 ENCOUNTER — Telehealth: Payer: Self-pay | Admitting: Neurology

## 2017-10-13 ENCOUNTER — Encounter: Payer: Self-pay | Admitting: Neurology

## 2017-10-13 ENCOUNTER — Telehealth: Payer: Self-pay | Admitting: Cardiology

## 2017-10-13 ENCOUNTER — Ambulatory Visit: Payer: BLUE CROSS/BLUE SHIELD

## 2017-10-13 VITALS — BP 131/86 | HR 93

## 2017-10-13 VITALS — BP 136/89 | HR 94 | Resp 18 | Ht 68.0 in | Wt 170.0 lb

## 2017-10-13 DIAGNOSIS — I951 Orthostatic hypotension: Secondary | ICD-10-CM | POA: Diagnosis not present

## 2017-10-13 DIAGNOSIS — R29898 Other symptoms and signs involving the musculoskeletal system: Secondary | ICD-10-CM | POA: Diagnosis not present

## 2017-10-13 DIAGNOSIS — Z9889 Other specified postprocedural states: Secondary | ICD-10-CM | POA: Diagnosis not present

## 2017-10-13 DIAGNOSIS — M545 Low back pain, unspecified: Secondary | ICD-10-CM

## 2017-10-13 DIAGNOSIS — M542 Cervicalgia: Secondary | ICD-10-CM | POA: Diagnosis not present

## 2017-10-13 DIAGNOSIS — G4489 Other headache syndrome: Secondary | ICD-10-CM | POA: Diagnosis not present

## 2017-10-13 MED ORDER — FLUOXETINE HCL 20 MG PO CAPS: 20.0000 mg | ORAL_CAPSULE | Freq: Every day | ORAL | 3 refills | Status: DC

## 2017-10-13 NOTE — Telephone Encounter (Signed)
Wants to know if she should continue PT and how many salt pills she's supposed to take

## 2017-10-13 NOTE — Telephone Encounter (Signed)
Patient requested to be seen at the end of April due to [place] [employer].
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
## 2017-10-13 NOTE — Telephone Encounter (Signed)
ok 

## 2017-10-13 NOTE — Telephone Encounter (Signed)
Patient advised okay to continue physical therapy. Advised to take one salt tablet twice daily. Patient verbalized understanding, no further questions.

## 2017-10-13 NOTE — Telephone Encounter (Signed)
1. Yes to PT  2. One salt tablet twice daily

## 2017-10-13 NOTE — Telephone Encounter (Signed)
Please advise if patient can continue physical therapy. Please advise how many salt pills to take.

## 2017-10-21 ENCOUNTER — Telehealth: Payer: Self-pay | Admitting: Physical Therapy

## 2017-10-21 NOTE — Telephone Encounter (Signed)
Pt unable to participate in final PT session due to continued syncopal symptoms.  Pt cleared by physician to continue with PT but pt did not have any other appointments scheduled.  Contacted pt and LVM requesting pt contact office to schedule more therapy visits as schedule and symptoms allow.  If therapist does not hear from pt in a few days, will attempt to contact again.  Dierdre HighmanAudra F Mirelle Biskup, PT, DPT 10/21/17    3:20 PM

## 2017-10-25 NOTE — Progress Notes (Signed)
Cardiology Office Note:    Date:  10/27/2017   ID:  Emily Miles, DOB 1980/11/09, MRN [id number]  PCP:  Blenda Mounts, MD  Cardiologist:  Norman Herrlich, MD    Referring MD: Blenda Mounts, MD    ASSESSMENT:    1. Orthostatic hypotension    PLAN:    In order of problems listed above:  1. Improved continue current endeavors to mitigate orthostatic hypotension avoid try cyclic antidepressants stopped await results of echocardiogram for evaluation of underlying heart disease and monitor for palpitation.  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 2. For headache syndrome is stable off try cyclic antidepressant managed by neurology   Next appointment: May 2019 before she leaves the area   Medication Adjustments/Labs and Tests Ordered: Current medicines are reviewed at length with the patient today.  Concerns regarding medicines are outlined above.  No orders of the defined types were placed in this encounter.  No orders of the defined types were placed in this encounter.   Chief Complaint  Patient presents with  . Follow-up    2 week flup appt     History of Present Illness:    Emily Miles is a 37 y.o. female with a hx of TBI, migraine, syncope and symptomatichypotension with syncope last seen 10/08/17.  ASSESSMENT:    1. Syncope, unspecified syncope type   2. Hypotension, unspecified hypotension type    PLAN:    1. Her presentation is significant with hypotension autonomic dysfunction and neurocardiogenic syncope.  The approach includes excluding other heart disease echocardiogram event monitor.  Reassurance is helpful.  To mitigate episodes I asked her to add salt to her diet to try to use over-the-counter coated salt tablets 2 a day drink adequate liquids and avoid prolonged immobilization.  To avoid pulling she will use an abdominal binder she does not want to use support hose.  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  If she had not been seen by neurology have referred her for evaluation of neurologic etiologies of loss of consciousness but I do not think it is needed at this time. 2. Discontinue tricyclic antidepressant add salt tablets sodium abdominal binder.  Compliance with diet, lifestyle and medications: Yes She is using over-the-counter salt tablets his water loading during the day wears an abdominal binder and avoids prolonged immobilization she has had a few episodes of lightheadedness not severe or not sustained has not disrupted her life and is able to participate in class.  Overall she is much more confident and feels she is in control.  She is due to have her echocardiogram and monitor applied Friday.  Still aware of her heart beating but when she tracks her heart rate rates in the range of 105 bpm.  She is no longer on beta-blocker or try cyclic antidepressant.  She is objectively improved with a standing blood pressure of 100 systolic. Past Medical History:  Diagnosis Date  . Panic attacks   . Thyroid disease   . Vision abnormalities     Past Surgical History:  Procedure Laterality Date  . BACK SURGERY    . CERVICAL SPINE SURGERY    . CHOLECYSTECTOMY    . Hemithyroidectomy      Current Medications: Current Meds  Medication  Sig  . buPROPion (WELLBUTRIN XL) 300 MG 24 hr tablet Take 300 mg by mouth daily.  . Lactobacillus (PROBIOTIC ACIDOPHILUS PO) Take 1 capsule by mouth daily. Takes in the morning  . levothyroxine (SYNTHROID) 125 MCG tablet Take 1 tablet (125 mcg total) by mouth daily before breakfast.  . liothyronine (CYTOMEL) 5 MCG tablet 1-1/2 tablets in the morning and 1 tablet at dinner  . Melatonin 5 MG TABS Take 1 tablet by mouth daily. Takes at night  . NUVARING 0.12-0.015 MG/24HR vaginal ring Place 1  each vaginally every 28 (twenty-eight) days.   . sodium chloride 1 g tablet Take 1 g by mouth 3 (three) times daily.  . traZODone (DESYREL) 50 MG tablet Take 100 mg by mouth at bedtime.      Allergies:   Percocet [oxycodone-acetaminophen]   Social History   Socioeconomic History  . Marital status: Single    Spouse name: None  . Number of children: None  . Years of education: None  . Highest education level: None  Social Needs  . Financial resource strain: None  . Food insecurity - worry: None  . Food insecurity - inability: None  . Transportation needs - medical: None  . Transportation needs - non-medical: None  Occupational History  . None  Tobacco Use  . Smoking status: Never Smoker  . Smokeless tobacco: Never Used  Substance and Sexual Activity  . Alcohol use: Yes  . Drug use: No  . Sexual activity: None  Other Topics Concern  . None  Social History Narrative  . None     Family History: The patient's family history includes Diabetes in her mother; Diverticulitis in her father; Hypothyroidism in her mother. ROS:   Please see the history of present illness.    All other systems reviewed and are negative.  EKGs/Labs/Other Studies Reviewed:    The following studies were reviewed today:    Recent Labs: 03/08/2017: Hemoglobin 12.1; Platelets 234 04/22/2017: ALT 14; BUN 15; Creatinine, Ser 0.84; Potassium 4.3; Sodium 138 06/02/2017: TSH 2.50  Recent Lipid Panel No results found for: CHOL, TRIG, HDL, CHOLHDL, VLDL, LDLCALC, LDLDIRECT  Physical Exam:    VS:  BP 108/72 (BP Location: Right Arm, Patient Position: Sitting, Cuff Size: Normal)   Pulse 100   Ht 5\' 8"  (1.727 m)   Wt 168 lb 1.9 oz (76.3 kg)   SpO2 98%   BMI 25.56 kg/m     Wt Readings from Last 3 Encounters:  10/27/17 168 lb 1.9 oz (76.3 kg)  10/13/17 170 lb (77.1 kg)  10/08/17 170 lb 9.6 oz (77.4 kg)    BP 104/80 standing  GEN:  Well nourished, well developed in no acute distress HEENT:  Normal NECK: No JVD; No carotid bruits LYMPHATICS: No lymphadenopathy CARDIAC: RRR, no murmurs, rubs, gallops RESPIRATORY:  Clear to auscultation without rales, wheezing or rhonchi  ABDOMEN: Soft, non-tender, non-distended MUSCULOSKELETAL:  No edema; No deformity  SKIN: Warm and dry NEUROLOGIC:  Alert and oriented x 3 PSYCHIATRIC:  Normal affect    Signed, Norman HerrlichBrian Rishard Delange, MD  10/27/2017 3:39 PM    [facility]

## 2017-10-27 ENCOUNTER — Ambulatory Visit: Payer: BLUE CROSS/BLUE SHIELD | Admitting: Cardiology

## 2017-10-27 ENCOUNTER — Encounter: Payer: Self-pay | Admitting: Cardiology

## 2017-10-27 VITALS — BP 108/72 | HR 100 | Ht 68.0 in | Wt 168.1 lb

## 2017-10-27 DIAGNOSIS — I951 Orthostatic hypotension: Secondary | ICD-10-CM | POA: Diagnosis not present

## 2017-10-27 NOTE — Patient Instructions (Signed)

## 2017-10-29 ENCOUNTER — Encounter: Payer: Self-pay | Admitting: Psychology

## 2017-10-29 ENCOUNTER — Ambulatory Visit (HOSPITAL_BASED_OUTPATIENT_CLINIC_OR_DEPARTMENT_OTHER)
Admission: RE | Admit: 2017-10-29 | Discharge: 2017-10-29 | Disposition: A | Discharge status: Home / Self Care | Payer: BLUE CROSS/BLUE SHIELD | Source: Ambulatory Visit | Attending: Cardiology | Admitting: Cardiology

## 2017-10-29 ENCOUNTER — Ambulatory Visit: Payer: BLUE CROSS/BLUE SHIELD

## 2017-10-29 ENCOUNTER — Encounter: Payer: BLUE CROSS/BLUE SHIELD | Attending: Physical Medicine & Rehabilitation | Admitting: Psychology

## 2017-10-29 DIAGNOSIS — Z833 Family history of diabetes mellitus: Secondary | ICD-10-CM | POA: Diagnosis not present

## 2017-10-29 DIAGNOSIS — E079 Disorder of thyroid, unspecified: Secondary | ICD-10-CM | POA: Diagnosis not present

## 2017-10-29 DIAGNOSIS — Z8379 Family history of other diseases of the digestive system: Secondary | ICD-10-CM | POA: Insufficient documentation

## 2017-10-29 DIAGNOSIS — Z9889 Other specified postprocedural states: Secondary | ICD-10-CM | POA: Diagnosis not present

## 2017-10-29 DIAGNOSIS — F418 Other specified anxiety disorders: Secondary | ICD-10-CM | POA: Insufficient documentation

## 2017-10-29 DIAGNOSIS — M533 Sacrococcygeal disorders, not elsewhere classified: Secondary | ICD-10-CM | POA: Diagnosis not present

## 2017-10-29 DIAGNOSIS — F4310 Post-traumatic stress disorder, unspecified: Secondary | ICD-10-CM

## 2017-10-29 DIAGNOSIS — Z981 Arthrodesis status: Secondary | ICD-10-CM | POA: Diagnosis not present

## 2017-10-29 DIAGNOSIS — H539 Unspecified visual disturbance: Secondary | ICD-10-CM | POA: Diagnosis not present

## 2017-10-29 DIAGNOSIS — S069X1A Unspecified intracranial injury with loss of consciousness of 30 minutes or less, initial encounter: Secondary | ICD-10-CM | POA: Diagnosis not present

## 2017-10-29 DIAGNOSIS — Z9049 Acquired absence of other specified parts of digestive tract: Secondary | ICD-10-CM | POA: Insufficient documentation

## 2017-10-29 DIAGNOSIS — I951 Orthostatic hypotension: Secondary | ICD-10-CM | POA: Insufficient documentation

## 2017-10-29 DIAGNOSIS — F411 Generalized anxiety disorder: Secondary | ICD-10-CM | POA: Diagnosis not present

## 2017-10-29 DIAGNOSIS — R55 Syncope and collapse: Secondary | ICD-10-CM | POA: Diagnosis not present
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
## 2017-10-29 NOTE — Progress Notes (Signed)
Echocardiogram 2D Echocardiogram has been performed.  Dorothey BasemanReel, Keyanna Sandefer M 10/29/2017, 3:35 PM

## 2017-11-05 ENCOUNTER — Encounter: Payer: Self-pay | Admitting: Physical Therapy

## 2017-11-05 ENCOUNTER — Ambulatory Visit: Payer: BLUE CROSS/BLUE SHIELD | Attending: Neurology | Admitting: Physical Therapy

## 2017-11-05 VITALS — BP 123/82 | HR 84

## 2017-11-05 DIAGNOSIS — R208 Other disturbances of skin sensation: Secondary | ICD-10-CM | POA: Diagnosis not present

## 2017-11-05 DIAGNOSIS — M6281 Muscle weakness (generalized): Secondary | ICD-10-CM | POA: Insufficient documentation

## 2017-11-05 DIAGNOSIS — R262 Difficulty in walking, not elsewhere classified: Secondary | ICD-10-CM | POA: Diagnosis present
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
## 2017-11-08 ENCOUNTER — Encounter: Payer: Self-pay | Admitting: Physical Therapy

## 2017-11-08 ENCOUNTER — Ambulatory Visit: Payer: BLUE CROSS/BLUE SHIELD | Admitting: Physical Therapy

## 2017-11-08 VITALS — BP 147/84 | HR 111

## 2017-11-08 DIAGNOSIS — R262 Difficulty in walking, not elsewhere classified: Secondary | ICD-10-CM

## 2017-11-08 DIAGNOSIS — M6281 Muscle weakness (generalized): Secondary | ICD-10-CM

## 2017-11-08 DIAGNOSIS — R208 Other disturbances of skin sensation: Secondary | ICD-10-CM | POA: Diagnosis not present
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
## 2017-11-22 ENCOUNTER — Encounter: Payer: BLUE CROSS/BLUE SHIELD | Admitting: Psychology

## 2017-11-24 ENCOUNTER — Encounter: Payer: Self-pay | Admitting: Physical Therapy

## 2017-11-24 ENCOUNTER — Ambulatory Visit: Payer: BLUE CROSS/BLUE SHIELD | Admitting: Physical Therapy

## 2017-11-24 DIAGNOSIS — M6281 Muscle weakness (generalized): Secondary | ICD-10-CM

## 2017-11-24 DIAGNOSIS — R262 Difficulty in walking, not elsewhere classified: Secondary | ICD-10-CM

## 2017-11-24 DIAGNOSIS — R208 Other disturbances of skin sensation: Secondary | ICD-10-CM | POA: Diagnosis not present
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
## 2017-11-25 ENCOUNTER — Encounter: Payer: BLUE CROSS/BLUE SHIELD | Admitting: Psychology

## 2017-11-25 ENCOUNTER — Other Ambulatory Visit (INDEPENDENT_AMBULATORY_CARE_PROVIDER_SITE_OTHER): Payer: BLUE CROSS/BLUE SHIELD

## 2017-11-25 DIAGNOSIS — E063 Autoimmune thyroiditis: Secondary | ICD-10-CM | POA: Diagnosis not present

## 2017-11-25 DIAGNOSIS — F4310 Post-traumatic stress disorder, unspecified: Secondary | ICD-10-CM | POA: Diagnosis not present

## 2017-11-25 DIAGNOSIS — F411 Generalized anxiety disorder: Secondary | ICD-10-CM | POA: Diagnosis not present

## 2017-11-25 DIAGNOSIS — S069X1A Unspecified intracranial injury with loss of consciousness of 30 minutes or less, initial encounter: Secondary | ICD-10-CM | POA: Diagnosis not present

## 2017-11-25 DIAGNOSIS — F329 Major depressive disorder, single episode, unspecified: Secondary | ICD-10-CM | POA: Diagnosis not present

## 2017-11-25 DIAGNOSIS — F32A Depression, unspecified: Secondary | ICD-10-CM

## 2017-11-25 DIAGNOSIS — M533 Sacrococcygeal disorders, not elsewhere classified: Secondary | ICD-10-CM | POA: Diagnosis not present

## 2017-11-25 LAB — T3, FREE: T3, Free: 3.5 pg/mL (ref 2.3–4.2)

## 2017-11-25 LAB — T4, FREE: Free T4: 0.91 ng/dL (ref 0.60–1.60)

## 2017-11-25 LAB — TSH: TSH: 0.91 u[IU]/mL (ref 0.35–4.50)

## 2017-11-28 NOTE — Progress Notes (Signed)
Patient ID: Emily SchwalbeJennifer Miles, female   DOB: 18-Apr-1981, 37 y.o.   MRN: [id number]           Referring Physician: Reola CalkinsBeck  Reason for Appointment:  Hypothyroidism and other problems, follow-up    History of Present Illness:   The patient has been on thyroid supplements since 2001 Initially apparently she was told to take thyroid supplement because of having a thyroid nodule or cyst She had hemithyroidectomy in 2002 for a thyroid nodule that was benign pathology  She had been continued on thyroid supplement subsequently and apparently was needing progressively higher doses of levothyroxine About 6 years ago patient was switched by an endocrinologist to Cytomel and levothyroxine At times she has had an increase of her thyroid medication made an when her thyroid levels are low she feels exhausted and has some weight gain but no other typical symptoms  She had been feeling fairly well this year until an episode of syncope in July. In April 2018 her TSH done by her PCP was 0.7 She had been taking 125 g of levothyroxine since 7/18 when her TSH was high  RECENT HISTORY:  On her initial consultation in 8/18 although she was feeling weak, tired and difficulty with staying up as well as weight gain her thyroid levels were normal and her doses were continued unchanged She apparently has some leg muscle weakness and this apparently was unrelated to her thyroid  She does still feel  Although her TSH was back to normal at 2.5 her last visit in October she was still complaining of feeling tired in the afternoon or evenings She was told to increase her liothyronine by half a tablet since her free T3 was low normal  She thinks that she feels a little better now However apparently in January she was having some issues with palpitations and she was told her TSH was 0.29 Her cardiologist reduced her liothyronine back to 5 mcg twice daily         Patient's weight history is as follows:  Wt Readings from  Last 3 Encounters:  11/29/17 169 lb (76.7 kg)  10/27/17 168 lb 1.9 oz (76.3 kg)  10/13/17 170 lb (77.1 kg)    Thyroid function results have been as follows:  Lab Results  Component Value Date   TSH 0.91 11/25/2017   TSH 2.50 06/02/2017   TSH 2.89 04/22/2017   TSH 7.890 (H) 03/09/2017   FREET4 0.91 11/25/2017   FREET4 0.97 06/02/2017   FREET4 0.99 04/22/2017   FREET4 0.63 03/09/2017   T3FREE 3.5 11/25/2017   T3FREE 2.8 06/02/2017   T3FREE 3.4 04/22/2017    Lab Results  Component Value Date   TSH 0.91 11/25/2017   TSH 2.50 06/02/2017   TSH 2.89 04/22/2017   TSH 7.890 (H) 03/09/2017   TSH 6.067 (H) 03/06/2017   Problem 2:  SYNCOPE and lightheadedness  She is being evaluated by her cardiologist for episodes of syncope and lightheadedness with low blood pressure episode She thinks her last syncopal episode in January was while she was sitting down However she thinks that sometimes even more recently she may feel a little lightheaded and her blood pressure may be as low as 70 standing up when she is symptomatic  She is currently not having any weight loss, nausea, weakness She did lose weight earlier this year because of an episode of depression   Past Medical History:  Diagnosis Date  . Panic attacks   . Thyroid disease   .  Vision abnormalities     Past Surgical History:  Procedure Laterality Date  . BACK SURGERY    . CERVICAL SPINE SURGERY    . CHOLECYSTECTOMY    . Hemithyroidectomy      Family History  Problem Relation Age of Onset  . Diabetes Mother   . Hypothyroidism Mother   . Diverticulitis Father     Social History:  reports that she has never smoked. She has never used smokeless tobacco. She reports that she drinks alcohol. She reports that she does not use drugs.  Allergies:  Allergies  Allergen Reactions  . Percocet [Oxycodone-Acetaminophen]     Allergies as of 11/29/2017      Reactions   Percocet [oxycodone-acetaminophen]         Medication List        Accurate as of 11/29/17  9:44 AM. Always use your most recent med list.          buPROPion 300 MG 24 hr tablet Commonly known as:  WELLBUTRIN XL Take 300 mg by mouth daily.   levothyroxine 125 MCG tablet Commonly known as:  SYNTHROID Take 1 tablet (125 mcg total) by mouth daily before breakfast.   liothyronine 5 MCG tablet Commonly known as:  CYTOMEL 1-1/2 tablets in the morning and 1 tablet at dinner   Melatonin 5 MG Tabs Take 1 tablet by mouth daily. Takes at night   NUVARING 0.12-0.015 MG/24HR vaginal ring Generic drug:  etonogestrel-ethinyl estradiol Place 1 each vaginally every 28 (twenty-eight) days.   PROBIOTIC ACIDOPHILUS PO Take 1 capsule by mouth daily. Takes in the morning   sodium chloride 1 g tablet Take 1 g by mouth 3 (three) times daily.   traZODone 50 MG tablet Commonly known as:  DESYREL Take 100 mg by mouth at bedtime.        Review of Systems    Still taking Wellbutrin for depression              Examination:    BP 110/70 (BP Location: Left Arm, Patient Position: Sitting, Cuff Size: Normal)   Pulse 71   Ht 5\' 8"  (1.727 m)   Wt 169 lb (76.7 kg)   SpO2 98%   BMI 25.70 kg/m   Thyroid not palpable No lymphadenopathy in the neck   Assessment:  HYPOTHYROIDISM, autoimmune with long history of thyroid supplementation  She has had various other problems causing her to have nonspecific symptoms of fatigue and weakness She is on a dose of Synthroid 125 g and liothyronine 5 g twice a day currently Although she was tried on additional 2.5 mcg of liothyronine on her last visit she did not follow-up subsequently for repeat visits and labs  The extra dose was stopped when she was having tachycardia  and syncope in January  She is objectively doing well now Euthyroid on exam Her thyroid levels are back to normal including TSH  Syncope and episodes of hypotension: She does not appear to have any clinical signs of  symptoms suggestive of adrenal insufficiency but discussed that if her cardiac evaluation is negative she may need tilt table testing If all this is negative she is likely to need Cortrosyn stimulation test and she will let us know she needs to come back in    PLAN:   Continue thyroid regimen unchanged Since she is moving away from the area she will come back as needed   Reather Littler 11/29/2017, 9:44 AM    Note: This office note was prepared with  [employer] therapist. Any transcriptional errors that result from this process are unintentional.

## 2017-11-29 ENCOUNTER — Encounter: Payer: Self-pay | Admitting: Endocrinology

## 2017-11-29 ENCOUNTER — Ambulatory Visit (INDEPENDENT_AMBULATORY_CARE_PROVIDER_SITE_OTHER): Payer: BLUE CROSS/BLUE SHIELD | Admitting: Endocrinology

## 2017-11-29 VITALS — BP 110/70 | HR 71 | Ht 68.0 in | Wt 169.0 lb

## 2017-11-29 DIAGNOSIS — E063 Autoimmune thyroiditis: Secondary | ICD-10-CM | POA: Diagnosis not present

## 2017-11-29 MED ORDER — LEVOTHYROXINE SODIUM 125 MCG PO TABS: 125.0000 ug | ORAL_TABLET | Freq: Every day | ORAL | 0 refills | Status: DC

## 2017-11-29 MED ORDER — LIOTHYRONINE SODIUM 5 MCG PO TABS: ORAL_TABLET | ORAL | 1 refills | Status: DC

## 2017-12-01 ENCOUNTER — Ambulatory Visit: Payer: BLUE CROSS/BLUE SHIELD | Admitting: Endocrinology

## 2017-12-02 ENCOUNTER — Encounter: Payer: Self-pay | Admitting: Physical Therapy

## 2017-12-02 ENCOUNTER — Ambulatory Visit: Payer: BLUE CROSS/BLUE SHIELD | Attending: Neurology | Admitting: Physical Therapy

## 2017-12-02 VITALS — BP 100/77 | HR 82

## 2017-12-02 DIAGNOSIS — R208 Other disturbances of skin sensation: Secondary | ICD-10-CM | POA: Diagnosis present

## 2017-12-02 DIAGNOSIS — R262 Difficulty in walking, not elsewhere classified: Secondary | ICD-10-CM | POA: Diagnosis present

## 2017-12-02 DIAGNOSIS — M6281 Muscle weakness (generalized): Secondary | ICD-10-CM | POA: Insufficient documentation
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
## 2017-12-06 ENCOUNTER — Ambulatory Visit: Payer: BLUE CROSS/BLUE SHIELD | Admitting: Psychology

## 2017-12-10 ENCOUNTER — Other Ambulatory Visit: Payer: Self-pay

## 2017-12-15 ENCOUNTER — Ambulatory Visit: Payer: BLUE CROSS/BLUE SHIELD | Admitting: Physical Therapy

## 2017-12-15 ENCOUNTER — Encounter: Payer: Self-pay | Admitting: Physical Therapy

## 2017-12-15 DIAGNOSIS — R262 Difficulty in walking, not elsewhere classified: Secondary | ICD-10-CM

## 2017-12-15 DIAGNOSIS — R208 Other disturbances of skin sensation: Secondary | ICD-10-CM

## 2017-12-15 DIAGNOSIS — M6281 Muscle weakness (generalized): Secondary | ICD-10-CM

## 2017-12-15 NOTE — Patient Instructions (Signed)
   Thereasa ParkinKettle Bell Swings beginning in an athletic position with shoulders back, explode the dumbell or kettle bell over eye level using the extension of the hips as propulsiton maintaining straight arms.  12 times     Overhead [employer]ettle[employer] Press  Start in a squat with the kettle bell in one hand in front; stand up nice and tall. Raise kettlebell overhead leading with your elbow.  Hol the weight as steady as possible. Keep abdominals tight so your back does not arch.  Perform 6-8 per side     Single Leg Russian Dead Lifts  Begin by stabilizing your core/trunk and looking up while you balance on one foot.  Now with the kettle bell in the opposite hand pivot forward at your hip (not the back) to a comfortable depth.  Stick your free leg back for balance and to assist with lifting.  Return to standing using your gluteal and hamstring muscles. Perform 6-8 per side      Side Plank w/ Kettlebell  Perform a plank on HANDS on side of bed or chair.  Hold Thereasa ParkinKettle Bell in one hand; twist to that side raising kettle bell with your top arm held overhead directly stacked over shoulder - lead with your elbow.  Return back to plank. Be sure to keep gluten, abdominals, lats, and scapular musculature engaged to keep spine stable. You should be able to draw a line from your nose thru your chin, sternum, belly button, knees and ankles.  Perform 6-8 per side
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
## 2017-12-21 ENCOUNTER — Ambulatory Visit: Payer: BLUE CROSS/BLUE SHIELD | Admitting: Physical Therapy

## 2017-12-21 ENCOUNTER — Encounter: Payer: Self-pay | Admitting: Physical Therapy

## 2017-12-21 DIAGNOSIS — R262 Difficulty in walking, not elsewhere classified: Secondary | ICD-10-CM

## 2017-12-21 DIAGNOSIS — R208 Other disturbances of skin sensation: Secondary | ICD-10-CM

## 2017-12-21 DIAGNOSIS — M6281 Muscle weakness (generalized): Secondary | ICD-10-CM
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
## 2017-12-21 NOTE — Progress Notes (Signed)
Cardiology Office Note:    Date:  12/22/2017   ID:  Emily Miles, DOB 03/19/1981, MRN [id number]  PCP:  Blenda MountsBeck, Matthew Todd, MD  Cardiologist:  Norman HerrlichBrian Munley, MD    Referring MD: Blenda MountsBeck, Matthew Todd, MD    ASSESSMENT:    1. Orthostatic hypotension    PLAN:    In order of problems listed above:  1. She is markedly improved has had no further syncope and is able to manage her hypertension through lifestyle modification no longer requires an abdominal binder.  She is back to physical activity and exercising and as mentioned has had no syncope or near syncope.  In retrospect I think a tricyclic and at present was a true precipitant and I told her I think she can live a normal life from this point on.   Next appointment: As needed   Medication Adjustments/Labs and Tests Ordered: Current medicines are reviewed at length with the patient today.  Concerns regarding medicines are outlined above.  No orders of the defined types were placed in this encounter.  No orders of the defined types were placed in this encounter.   Chief Complaint  Patient presents with  . Follow-up    syncope  . Hypotension    History of Present Illness:    Emily Miles is a [age] y.o. female with a hx of TBI, migraine, syncope and symptomatichypotension with syncope last seen 10/27/17.  ASSESSMENT:  10/27/17   1. Syncope, unspecified syncope type   2. Hypotension, unspecified hypotension type   PLAN:    2. Her presentation is significant with hypotension autonomic dysfunction and neurocardiogenic syncope. The approach includes excluding other heart disease echocardiogram event monitor. Reassurance is helpful. To mitigate episodes I asked her to add salt to her diet to try to use over-the-counter coated salt tablets 2 a day drink adequate liquids and avoid prolonged immobilization. To avoid pulling she will use an abdominal binder she does not want to use support hose. 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 If she had not been seen by neurology have referred her for evaluation of neurologic etiologies of loss of consciousness but I do not think it is needed at this time. 3. Discontinue tricyclic antidepressant add salt tablets sodium abdominal binder.  Compliance with diet, lifestyle and medications: Yes She is markedly improved has had no episodes of syncope or near syncope and stopped using the abdominal binder. Past Medical History:  Diagnosis Date  . Panic attacks   . Thyroid disease   . Vision abnormalities     Past Surgical History:  Procedure Laterality Date  . BACK SURGERY    . CERVICAL SPINE SURGERY    . CHOLECYSTECTOMY    . Hemithyroidectomy      Current Medications: Current Meds  Medication Sig  . buPROPion (WELLBUTRIN XL) 300 MG 24 hr tablet Take 300 mg by mouth daily.  . Lactobacillus (PROBIOTIC ACIDOPHILUS PO) Take 1 capsule by mouth daily. Takes in the morning  . levothyroxine (SYNTHROID) 125 MCG tablet Take 1 tablet (125 mcg total) by mouth daily before breakfast.  . liothyronine (CYTOMEL) 5 MCG tablet 1 tablet in the morning and 1 tablet at dinner  . meloxicam (MOBIC) 7.5 MG tablet Take 7.5 mg by mouth daily.  Marland Kitchen. NUVARING 0.12-0.015 MG/24HR vaginal ring Place 1 each vaginally every 28 (  twenty-eight) days.   . sodium chloride 1 g tablet Take 1 g by mouth 3 (three) times daily.  . traZODone (DESYREL) 100 MG tablet Take 100 mg by mouth at bedtime.      Allergies:   Percocet [oxycodone-acetaminophen]   Social History   Socioeconomic History  . Marital status: Single    Spouse name: Not on file  . Number of children: Not on file  . Years of education: Not on file  . Highest education level: Not on file  Occupational History  . Not on file  Social Needs  . Financial resource strain: Not  on file  . Food insecurity:    Worry: Not on file    Inability: Not on file  . Transportation needs:    Medical: Not on file    Non-medical: Not on file  Tobacco Use  . Smoking status: Never Smoker  . Smokeless tobacco: Never Used  Substance and Sexual Activity  . Alcohol use: Yes  . Drug use: No  . Sexual activity: Not on file  Lifestyle  . Physical activity:    Days per week: Not on file    Minutes per session: Not on file  . Stress: Not on file  Relationships  . Social connections:    Talks on phone: Not on file    Gets together: Not on file    Attends religious service: Not on file    Active member of club or organization: Not on file    Attends meetings of clubs or organizations: Not on file    Relationship status: Not on file  Other Topics Concern  . Not on file  Social History Narrative  . Not on file     Family History: The patient's family history includes Diabetes in her mother; Diverticulitis in her father; Hypothyroidism in her mother. ROS:   Please see the history of present illness.    All other systems reviewed and are negative.  EKGs/Labs/Other Studies Reviewed:    The following studies were reviewed today:  Recent Labs: 03/08/2017: Hemoglobin 12.1; Platelets 234 04/22/2017: ALT 14; BUN 15; Creatinine, Ser 0.84; Potassium 4.3; Sodium 138 11/25/2017: TSH 0.91  Recent Lipid Panel No results found for: CHOL, TRIG, HDL, CHOLHDL, VLDL, LDLCALC, LDLDIRECT  Physical Exam:    VS:  BP 112/68 (BP Location: Left Arm, Patient Position: Sitting, Cuff Size: Normal)   Pulse 98   Ht 5\' 8"  (1.727 m)   Wt 169 lb 12.8 oz (77 kg)   SpO2 99%   BMI 25.82 kg/m     Wt Readings from Last 3 Encounters:  12/22/17 169 lb 12.8 oz (77 kg)  11/29/17 169 lb (76.7 kg)  10/27/17 168 lb 1.9 oz (76.3 kg)     GEN:  Well nourished, well developed in no acute distress HEENT: Normal NECK: No JVD; No carotid bruits LYMPHATICS: No lymphadenopathy CARDIAC: RRR, no murmurs,  rubs, gallops RESPIRATORY:  Clear to auscultation without rales, wheezing or rhonchi  ABDOMEN: Soft, non-tender, non-distended MUSCULOSKELETAL:  No edema; No deformity  SKIN: Warm and dry NEUROLOGIC:  Alert and oriented x 3 PSYCHIATRIC:  Normal affect    Signed, Norman Herrlich, MD  12/22/2017 3:53 PM    [facility]

## 2017-12-21 NOTE — Patient Instructions (Addendum)
   KETTLEBELL - TURKISH GET UP  1. Lie on your side holding a Kettlebell with the bottom most arm.   2. Roll on your back and straighten one leg and bend the knee on the same side as the bell.  3. Raise kettlebell up towards the ceiling and straighten your elbow. Kettlebell should be held up this way for all remaining steps.   4. Prop up on your opposite elbow.   5. Prop up on your hand and bridge up your pelvis  6-7. Bring your front leg under you and towards the back into a kneeling position.   8. Straighten your hip  9.  Stand up  Then, reverse all steps and return to original position.  Perform 5-8 reps (both sides)    Stretches:     CHILD POSE - PRAYER STRETCH - FORWARDS AND RIGHT/LEFT LATERAL  While on your hand and knees in a crawl position, slowly lower your buttocks towards your feet. Also, lower your chest towards the floor as you reach out towards the side.     Hip External Rotation - Piriformis Stretching  - Sit at a bed or the floor, with one knee bent (knee is out, foot is pointed inwards) and the other leg extended behind you - Roll your body weight so that your trunk is 'square' to your legs; you may use your arms to help support your body above your pelvis - To increase the stretch, lower your pelvis close to the ground or lean forwards with a straight back - The stretch may be felt in the outside buttock region or relatively deep in the pelvis     BUTTERFLY STRETCH  While in a sitting position, bend your knees and place the bottom of your feet together.   Next, slowly let your knees lower towards the floor until a stretch is felt at your inner thighs.     Hamstring Stretch (Step)  Stand upright with your heel on a step or sturdy chair. Keeping your trunk and back straight, lean forward at your hips until you feel a stretch on the back of your thigh and calf. Hold this stretch for 30 seconds.

## 2017-12-22 ENCOUNTER — Ambulatory Visit: Payer: BLUE CROSS/BLUE SHIELD | Admitting: Cardiology

## 2017-12-22 ENCOUNTER — Encounter: Payer: Self-pay | Admitting: Cardiology

## 2017-12-22 VITALS — BP 112/68 | HR 98 | Ht 68.0 in | Wt 169.8 lb

## 2017-12-22 DIAGNOSIS — I951 Orthostatic hypotension: Secondary | ICD-10-CM | POA: Diagnosis not present

## 2017-12-22 NOTE — Patient Instructions (Signed)
Medication Instructions:   Your physician recommends that you continue on your current medications as directed. Please refer to the Current Medication list given to you today.  Labwork:  None  Testing/Procedures:  None  Follow-Up:  Your physician recommends that you schedule a follow-up appointment in: as needed.  Any Other Special Instructions Will Be Listed Below (If Applicable).  If you need a refill on your cardiac medications before your next appointment, please call your pharmacy. 

## 2017-12-24 ENCOUNTER — Ambulatory Visit: Payer: BLUE CROSS/BLUE SHIELD | Admitting: Psychology

## 2017-12-25 ENCOUNTER — Other Ambulatory Visit: Payer: Self-pay | Admitting: Endocrinology

## 2017-12-27 ENCOUNTER — Ambulatory Visit (INDEPENDENT_AMBULATORY_CARE_PROVIDER_SITE_OTHER): Payer: BLUE CROSS/BLUE SHIELD | Admitting: Neurology

## 2017-12-27 ENCOUNTER — Encounter: Payer: Self-pay | Admitting: Neurology

## 2017-12-27 VITALS — BP 111/71 | HR 74 | Ht 68.0 in | Wt 171.5 lb

## 2017-12-27 DIAGNOSIS — Z9889 Other specified postprocedural states: Secondary | ICD-10-CM | POA: Diagnosis not present

## 2017-12-27 DIAGNOSIS — R519 Headache, unspecified: Secondary | ICD-10-CM

## 2017-12-27 DIAGNOSIS — R51 Headache: Secondary | ICD-10-CM | POA: Diagnosis not present

## 2017-12-27 DIAGNOSIS — I951 Orthostatic hypotension: Secondary | ICD-10-CM | POA: Diagnosis not present

## 2017-12-27 DIAGNOSIS — M5431 Sciatica, right side: Secondary | ICD-10-CM | POA: Diagnosis not present
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
## 2017-12-31 ENCOUNTER — Encounter: Payer: Self-pay | Admitting: Psychology
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
## 2018-01-04 ENCOUNTER — Other Ambulatory Visit: Payer: Self-pay | Admitting: Neurology

## 2018-01-25 ENCOUNTER — Ambulatory Visit: Payer: BLUE CROSS/BLUE SHIELD | Admitting: Cardiology

## 2018-01-29 ENCOUNTER — Other Ambulatory Visit: Payer: Self-pay | Admitting: Endocrinology

## 2018-02-28 ENCOUNTER — Telehealth: Payer: Self-pay | Admitting: Endocrinology

## 2018-02-28 ENCOUNTER — Other Ambulatory Visit: Payer: Self-pay

## 2018-02-28 MED ORDER — LEVOTHYROXINE SODIUM 125 MCG PO TABS: 125.0000 ug | ORAL_TABLET | Freq: Every day | ORAL | 0 refills | Status: AC

## 2018-02-28 NOTE — Telephone Encounter (Signed)
Rx sent with message to pharmacy that pt must now get medication from a local doctor.

## 2018-02-28 NOTE — Telephone Encounter (Signed)
She can have a 30-day prescription only but needs to get it from a local doctor now

## 2018-02-28 NOTE — Telephone Encounter (Signed)
Patient request a refill for medication levothyroxine  Send to the [facility] [place], [place] [id number]  Contact number [phone]

## 2018-02-28 NOTE — Telephone Encounter (Signed)
Has not been seen since April 2019. Okay to refill?

## 2019-04-30 IMAGING — MR MR THORACIC SPINE W/O CM
4 of 6 series · 11 of 48 positions shown · non-contrast
Comparison: None.

CLINICAL DATA: Weakness lower extremities. Leg numbness. Rule out
myelitis or cord compression

EXAM:
MRI THORACIC SPINE WITHOUT CONTRAST
TECHNIQUE: Multiplanar, multisequence MR imaging of the thoracic spine was
performed. No intravenous contrast was administered.

[Series 5: T1 · sagittal · 4.0mm · 0.55mm/px · 3 of 13 slices shown (1 of 2)]
[im 3/13]
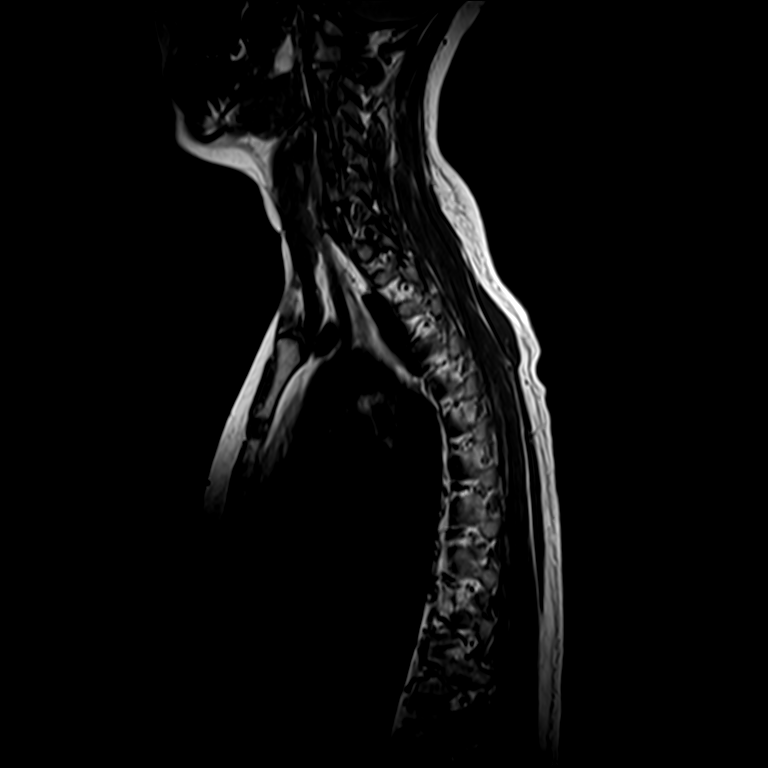
[im 8/13]
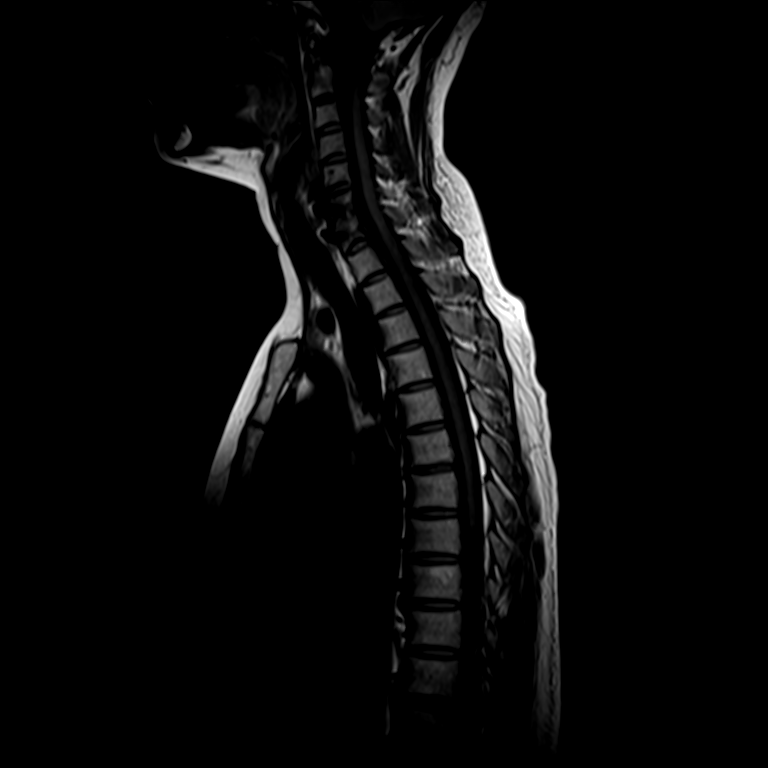
[im 13/13]
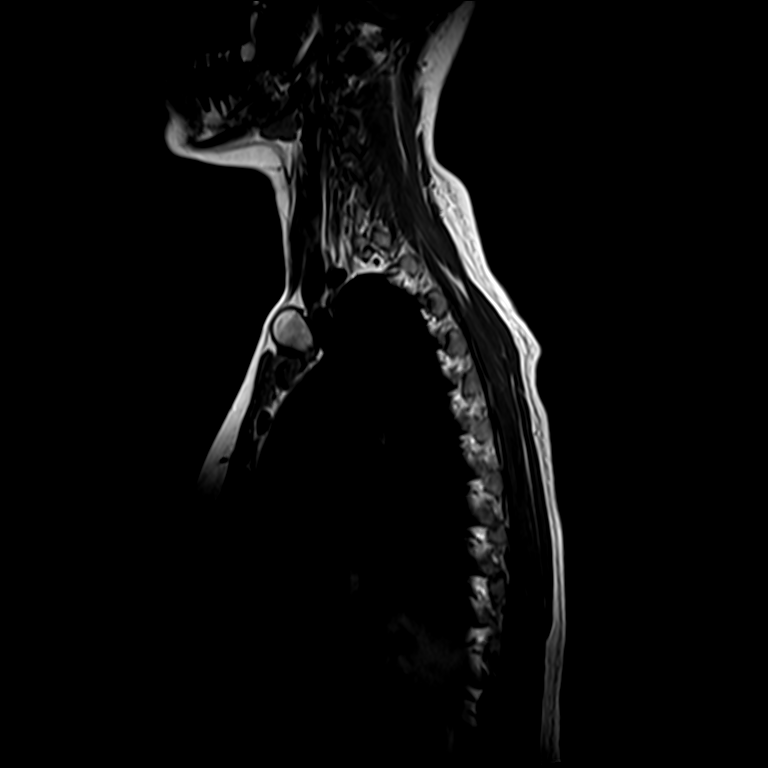

[Series 6: T2 · sagittal · 4.0mm · 0.40mm/px · 3 of 13 slices shown (1 of 2)]
[im 1/13]
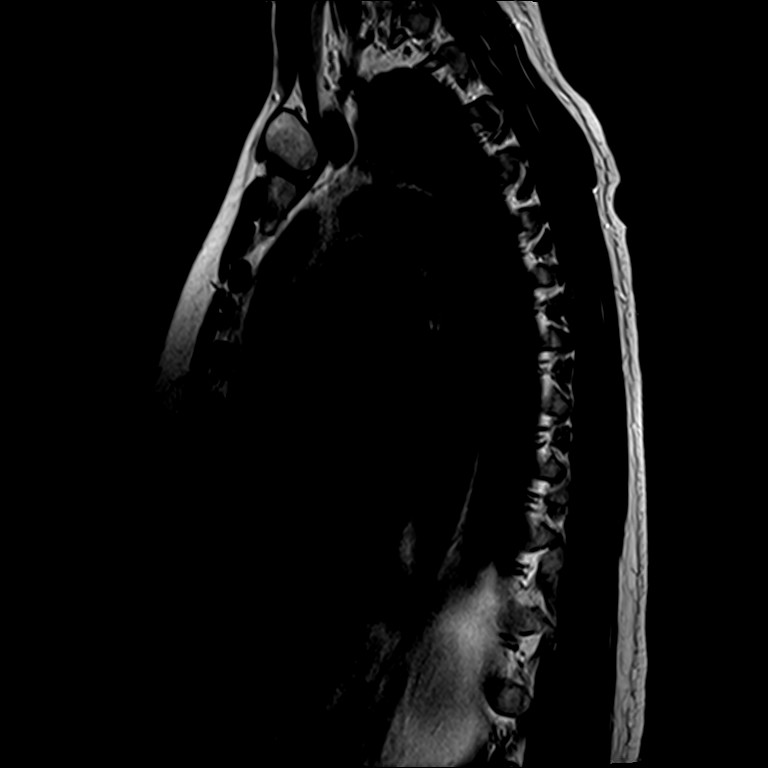
[im 7/13]
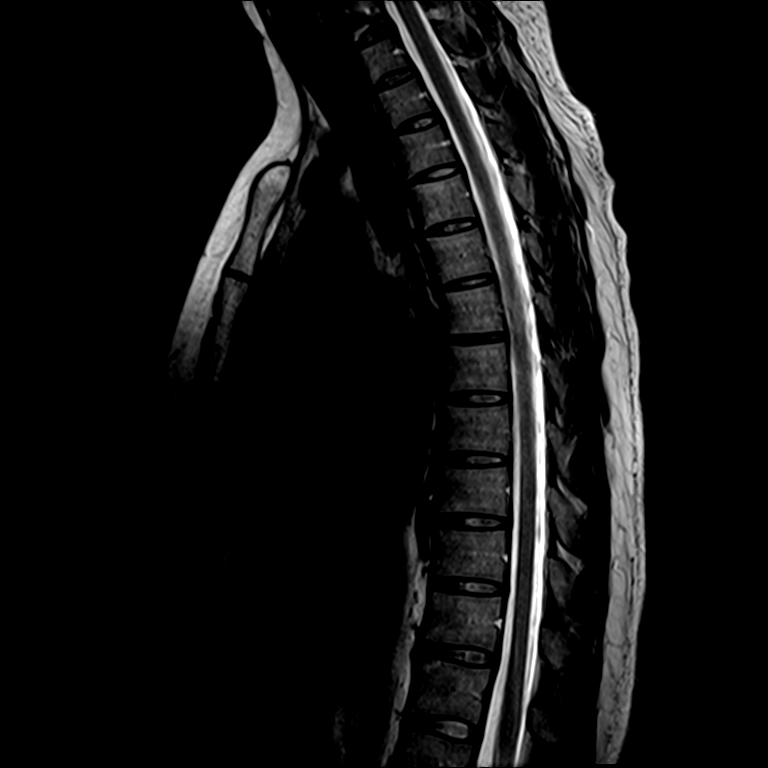
[im 13/13]
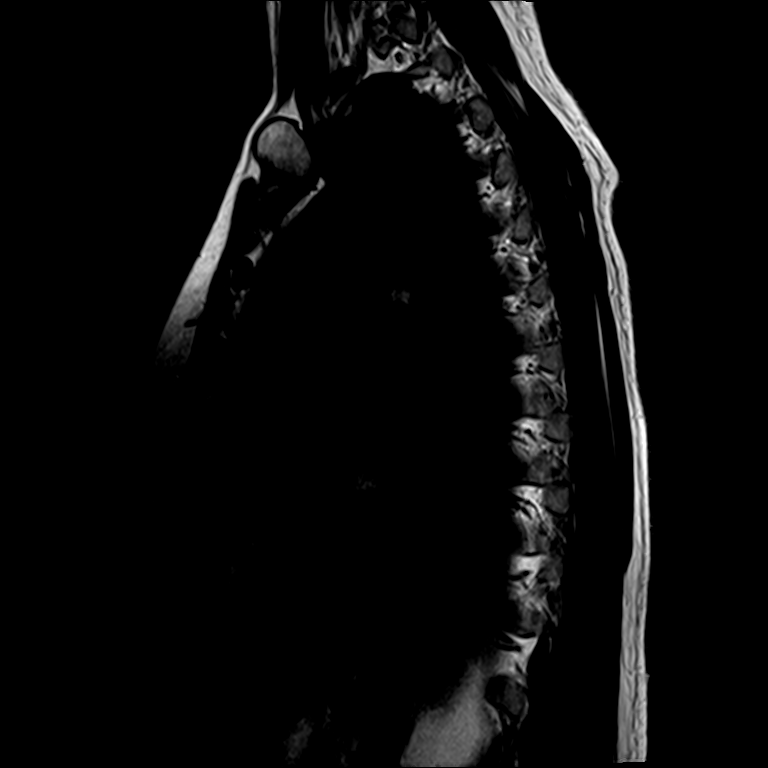

[Series 7: T1 · sagittal · 4.0mm · 0.40mm/px · 2 of 13 slices shown (2 of 2)]
[im 1/13]
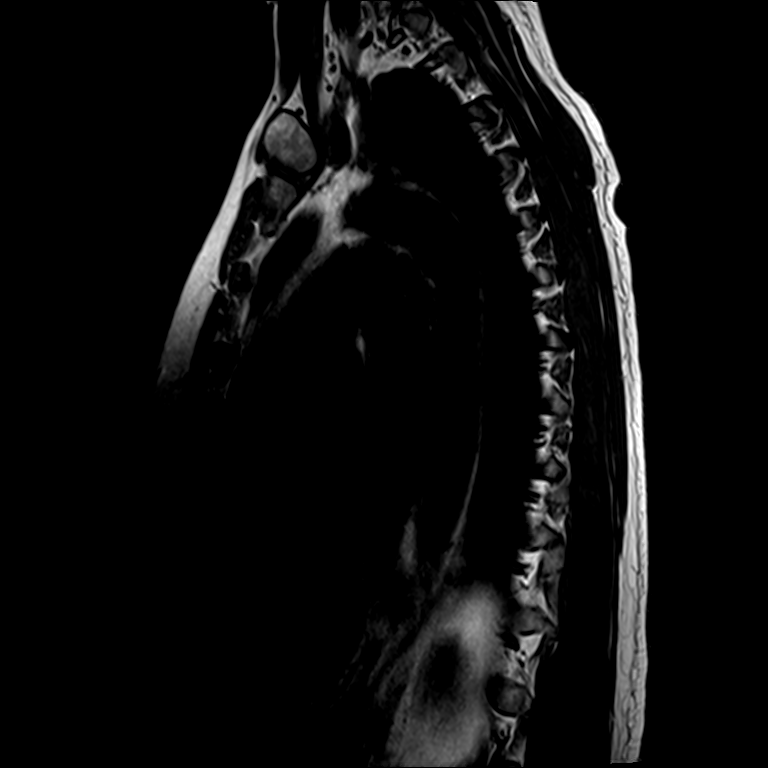
[im 7/13]
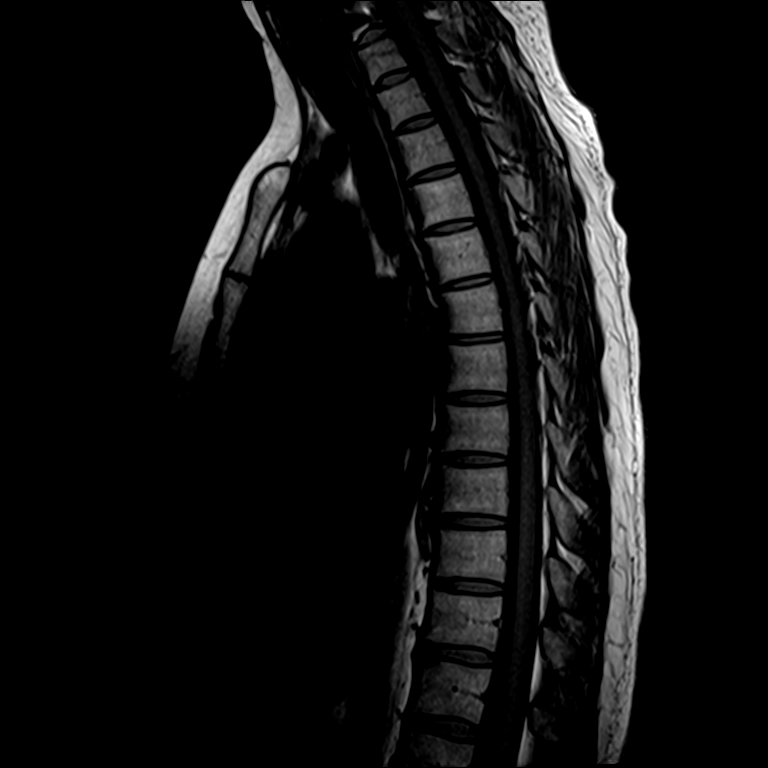

[Series 10: T2 · axial · 3.0mm · 0.24mm/px · z∈[-207,-58]mm · 3 of 33 slices shown (2 of 2)]
[im 6/33]
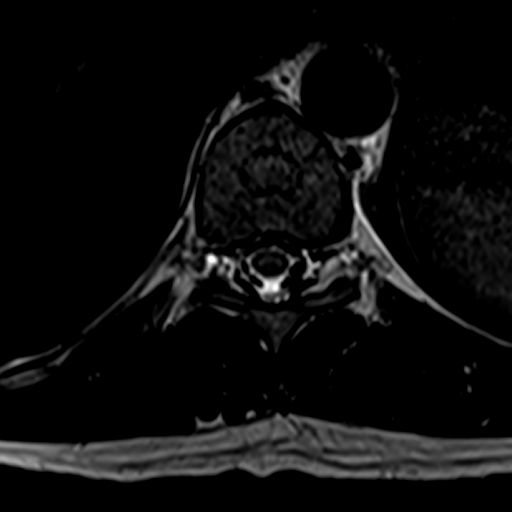
[im 17/33]
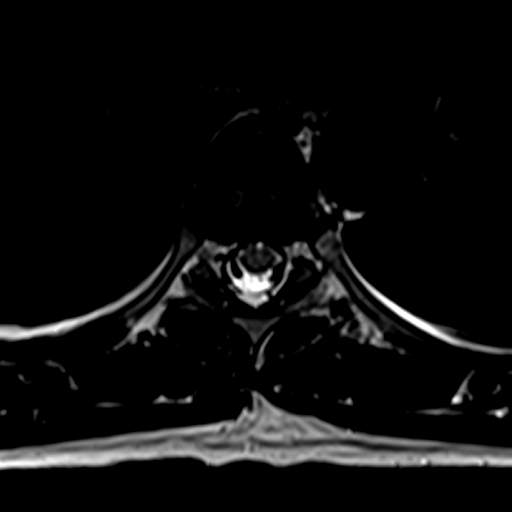
[im 27/33]
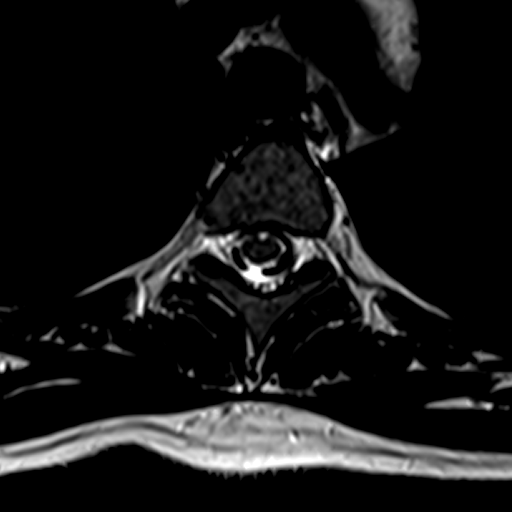

[11 of 48 positions shown; findings below may reference images not displayed]

FINDINGS: Alignment:  Normal

Vertebrae: Normal

Cord:  Normal

Paraspinal and other soft tissues: Normal

Disc levels:

Mild disc degeneration T6-7 with small right-sided disc protrusion.
Remaining disc spaces are normal.
IMPRESSION: Small right-sided disc protrusion T6-7. Otherwise within normal
limits.
